# Patient Record
Sex: Female | Born: 1960 | Hispanic: No | Marital: Married | Smoking: Former smoker
Health system: Southern US, Community
[De-identification: ages and names within clinical notes are randomized; demographics above are authoritative.]

## PROBLEM LIST (undated history)

## (undated) DIAGNOSIS — I428 Other cardiomyopathies: Secondary | ICD-10-CM

## (undated) DIAGNOSIS — I5020 Unspecified systolic (congestive) heart failure: Secondary | ICD-10-CM

## (undated) DIAGNOSIS — R0989 Other specified symptoms and signs involving the circulatory and respiratory systems: Secondary | ICD-10-CM

## (undated) DIAGNOSIS — F32A Depression, unspecified: Secondary | ICD-10-CM

## (undated) DIAGNOSIS — R931 Abnormal findings on diagnostic imaging of heart and coronary circulation: Secondary | ICD-10-CM

## (undated) DIAGNOSIS — F419 Anxiety disorder, unspecified: Secondary | ICD-10-CM

## (undated) DIAGNOSIS — E785 Hyperlipidemia, unspecified: Secondary | ICD-10-CM

## (undated) DIAGNOSIS — J189 Pneumonia, unspecified organism: Secondary | ICD-10-CM

## (undated) HISTORY — DX: Hyperlipidemia, unspecified: E78.5

## (undated) HISTORY — DX: Other cardiomyopathies: I42.8

## (undated) HISTORY — DX: Abnormal findings on diagnostic imaging of heart and coronary circulation: R93.1

## (undated) HISTORY — DX: Unspecified systolic (congestive) heart failure: I50.20

## (undated) HISTORY — DX: Other specified symptoms and signs involving the circulatory and respiratory systems: R09.89

---

## 1999-03-06 ENCOUNTER — Other Ambulatory Visit: Admission: RE | Admit: 1999-03-06 | Discharge: 1999-03-06 | Payer: Self-pay | Admitting: Family Medicine

## 2017-06-12 ENCOUNTER — Other Ambulatory Visit: Payer: Self-pay | Admitting: Orthopedic Surgery

## 2017-06-12 DIAGNOSIS — M4726 Other spondylosis with radiculopathy, lumbar region: Secondary | ICD-10-CM

## 2017-06-12 DIAGNOSIS — M4722 Other spondylosis with radiculopathy, cervical region: Secondary | ICD-10-CM

## 2017-06-16 ENCOUNTER — Ambulatory Visit
Admission: RE | Admit: 2017-06-16 | Discharge: 2017-06-16 | Disposition: A | Discharge status: Home / Self Care | Payer: Federal, State, Local not specified - PPO | Source: Ambulatory Visit | Attending: Orthopedic Surgery | Admitting: Orthopedic Surgery

## 2017-06-16 DIAGNOSIS — M4726 Other spondylosis with radiculopathy, lumbar region: Secondary | ICD-10-CM

## 2017-06-16 DIAGNOSIS — M4722 Other spondylosis with radiculopathy, cervical region: Secondary | ICD-10-CM

## 2018-05-16 DIAGNOSIS — M542 Cervicalgia: Secondary | ICD-10-CM

## 2018-05-16 DIAGNOSIS — J45909 Unspecified asthma, uncomplicated: Secondary | ICD-10-CM | POA: Insufficient documentation

## 2018-05-16 DIAGNOSIS — M549 Dorsalgia, unspecified: Secondary | ICD-10-CM | POA: Insufficient documentation

## 2018-05-16 DIAGNOSIS — G8929 Other chronic pain: Secondary | ICD-10-CM

## 2018-05-16 HISTORY — DX: Other chronic pain: G89.29

## 2018-05-16 HISTORY — DX: Cervicalgia: M54.2

## 2018-05-16 HISTORY — DX: Unspecified asthma, uncomplicated: J45.909

## 2018-06-27 DIAGNOSIS — M51369 Other intervertebral disc degeneration, lumbar region without mention of lumbar back pain or lower extremity pain: Secondary | ICD-10-CM

## 2018-06-27 DIAGNOSIS — M5136 Other intervertebral disc degeneration, lumbar region: Secondary | ICD-10-CM

## 2018-06-27 DIAGNOSIS — M5412 Radiculopathy, cervical region: Secondary | ICD-10-CM

## 2018-06-27 HISTORY — DX: Radiculopathy, cervical region: M54.12

## 2018-06-27 HISTORY — DX: Other intervertebral disc degeneration, lumbar region: M51.36

## 2018-06-27 HISTORY — DX: Other intervertebral disc degeneration, lumbar region without mention of lumbar back pain or lower extremity pain: M51.369

## 2018-06-28 DIAGNOSIS — M5416 Radiculopathy, lumbar region: Secondary | ICD-10-CM

## 2018-06-28 HISTORY — DX: Radiculopathy, lumbar region: M54.16

## 2018-07-17 DIAGNOSIS — M419 Scoliosis, unspecified: Secondary | ICD-10-CM

## 2018-07-17 HISTORY — DX: Scoliosis, unspecified: M41.9
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
## 2019-06-09 DIAGNOSIS — R05 Cough: Secondary | ICD-10-CM

## 2019-06-09 DIAGNOSIS — R0600 Dyspnea, unspecified: Secondary | ICD-10-CM

## 2019-06-09 DIAGNOSIS — I340 Nonrheumatic mitral (valve) insufficiency: Secondary | ICD-10-CM

## 2019-06-10 ENCOUNTER — Inpatient Hospital Stay (HOSPITAL_COMMUNITY)
Admission: AD | Admit: 2019-06-10 | Discharge: 2019-06-12 | DRG: 287 | Disposition: A | Discharge status: Home / Self Care | Payer: Federal, State, Local not specified - PPO | Source: Other Acute Inpatient Hospital | Attending: Interventional Cardiology | Admitting: Interventional Cardiology

## 2019-06-10 ENCOUNTER — Encounter (HOSPITAL_COMMUNITY): Payer: Self-pay | Admitting: Interventional Cardiology

## 2019-06-10 DIAGNOSIS — I472 Ventricular tachycardia: Secondary | ICD-10-CM

## 2019-06-10 DIAGNOSIS — Z91040 Latex allergy status: Secondary | ICD-10-CM

## 2019-06-10 DIAGNOSIS — Z20828 Contact with and (suspected) exposure to other viral communicable diseases: Secondary | ICD-10-CM | POA: Diagnosis present

## 2019-06-10 DIAGNOSIS — I200 Unstable angina: Secondary | ICD-10-CM

## 2019-06-10 DIAGNOSIS — R0989 Other specified symptoms and signs involving the circulatory and respiratory systems: Secondary | ICD-10-CM

## 2019-06-10 DIAGNOSIS — I428 Other cardiomyopathies: Secondary | ICD-10-CM | POA: Diagnosis present

## 2019-06-10 DIAGNOSIS — I420 Dilated cardiomyopathy: Secondary | ICD-10-CM

## 2019-06-10 DIAGNOSIS — I5021 Acute systolic (congestive) heart failure: Secondary | ICD-10-CM

## 2019-06-10 DIAGNOSIS — I5020 Unspecified systolic (congestive) heart failure: Secondary | ICD-10-CM

## 2019-06-10 DIAGNOSIS — Z8249 Family history of ischemic heart disease and other diseases of the circulatory system: Secondary | ICD-10-CM

## 2019-06-10 DIAGNOSIS — R079 Chest pain, unspecified: Secondary | ICD-10-CM

## 2019-06-10 DIAGNOSIS — Z87891 Personal history of nicotine dependence: Secondary | ICD-10-CM

## 2019-06-10 HISTORY — DX: Acute systolic (congestive) heart failure: I50.21

## 2019-06-10 LAB — CBC
HCT: 42.7 % (ref 36.0–46.0)
Hemoglobin: 13.8 g/dL (ref 12.0–15.0)
MCH: 29.6 pg (ref 26.0–34.0)
MCHC: 32.3 g/dL (ref 30.0–36.0)
MCV: 91.6 fL (ref 80.0–100.0)
Platelets: 254 10*3/uL (ref 150–400)
RBC: 4.66 MIL/uL (ref 3.87–5.11)
RDW: 13.5 % (ref 11.5–15.5)
WBC: 7.2 10*3/uL (ref 4.0–10.5)
nRBC: 0 % (ref 0.0–0.2)

## 2019-06-10 LAB — COMPREHENSIVE METABOLIC PANEL
ALT: 42 U/L (ref 0–44)
AST: 23 U/L (ref 15–41)
Albumin: 3.8 g/dL (ref 3.5–5.0)
Alkaline Phosphatase: 60 U/L (ref 38–126)
Anion gap: 12 (ref 5–15)
BUN: 23 mg/dL — ABNORMAL HIGH (ref 6–20)
CO2: 27 mmol/L (ref 22–32)
Calcium: 9.1 mg/dL (ref 8.9–10.3)
Chloride: 99 mmol/L (ref 98–111)
Creatinine, Ser: 0.83 mg/dL (ref 0.44–1.00)
GFR calc Af Amer: >60 mL/min (ref >60)
GFR calc non Af Amer: >60 mL/min (ref >60)
Glucose, Bld: 143 mg/dL — ABNORMAL HIGH (ref 70–99)
Potassium: 3.5 mmol/L (ref 3.5–5.1)
Sodium: 138 mmol/L (ref 135–145)
Total Bilirubin: 0.7 mg/dL (ref 0.3–1.2)
Total Protein: 6.8 g/dL (ref 6.5–8.1)

## 2019-06-10 LAB — HIV ANTIBODY (ROUTINE TESTING W REFLEX): HIV Screen 4th Generation wRfx: NONREACTIVE

## 2019-06-10 LAB — HEPARIN LEVEL (UNFRACTIONATED): Heparin Unfractionated: 0.21 IU/mL — ABNORMAL LOW (ref 0.30–0.70)

## 2019-06-10 LAB — BRAIN NATRIURETIC PEPTIDE: B Natriuretic Peptide: 425 pg/mL — ABNORMAL HIGH (ref 0.0–100.0)

## 2019-06-10 MED ORDER — ONDANSETRON HCL 4 MG/2ML IJ SOLN: 4.0000 mg | Freq: Four times a day (QID) | INTRAMUSCULAR | Status: DC | PRN

## 2019-06-10 MED ORDER — NITROGLYCERIN 0.4 MG SL SUBL: 0.4000 mg | SUBLINGUAL_TABLET | SUBLINGUAL | Status: DC | PRN

## 2019-06-10 MED ORDER — SODIUM CHLORIDE 0.9 % IV SOLN: INTRAVENOUS | Status: DC

## 2019-06-10 MED ORDER — SODIUM CHLORIDE 0.9% FLUSH: 3.0000 mL | INTRAVENOUS | Status: DC | PRN

## 2019-06-10 MED ORDER — ACETAMINOPHEN 325 MG PO TABS
650.0000 mg | ORAL_TABLET | ORAL | Status: DC | PRN
  Administered 2019-06-10: 650 mg via ORAL
  Filled 2019-06-10: qty 2

## 2019-06-10 MED ORDER — HEPARIN (PORCINE) 25000 UT/250ML-% IV SOLN
1350.0000 [IU]/h | INTRAVENOUS | Status: DC
  Administered 2019-06-10: 1350 [IU]/h via INTRAVENOUS
  Administered 2019-06-10: 1100 [IU]/h via INTRAVENOUS
  Filled 2019-06-10: qty 250

## 2019-06-10 MED ORDER — CARVEDILOL 3.125 MG PO TABS
3.1250 mg | ORAL_TABLET | Freq: Two times a day (BID) | ORAL | Status: DC
  Administered 2019-06-10: 3.125 mg via ORAL
  Filled 2019-06-10: qty 1

## 2019-06-10 MED ORDER — ASPIRIN 81 MG PO CHEW
81.0000 mg | CHEWABLE_TABLET | ORAL | Status: AC
  Administered 2019-06-11: 81 mg via ORAL
  Filled 2019-06-10: qty 1

## 2019-06-10 MED ORDER — SODIUM CHLORIDE 0.9 % IV SOLN: 250.0000 mL | INTRAVENOUS | Status: DC | PRN

## 2019-06-10 MED ORDER — SODIUM CHLORIDE 0.9% FLUSH
3.0000 mL | Freq: Two times a day (BID) | INTRAVENOUS | Status: DC
  Administered 2019-06-10: 3 mL via INTRAVENOUS

## 2019-06-10 MED ORDER — ASPIRIN EC 81 MG PO TBEC
81.0000 mg | DELAYED_RELEASE_TABLET | Freq: Every day | ORAL | Status: DC
  Administered 2019-06-10 – 2019-06-12 (×2): 81 mg via ORAL
  Filled 2019-06-10 (×3): qty 1

## 2019-06-10 MED ORDER — ATORVASTATIN CALCIUM 40 MG PO TABS
40.0000 mg | ORAL_TABLET | Freq: Every day | ORAL | Status: DC
  Administered 2019-06-10 – 2019-06-11 (×2): 40 mg via ORAL
  Filled 2019-06-10 (×2): qty 1

## 2019-06-10 MED ORDER — CARVEDILOL 3.125 MG PO TABS
3.1250 mg | ORAL_TABLET | Freq: Two times a day (BID) | ORAL | Status: DC
  Administered 2019-06-11 – 2019-06-12 (×3): 3.125 mg via ORAL
  Filled 2019-06-10 (×3): qty 1

## 2019-06-10 NOTE — H&P (Addendum)
The patient has been seen in conjunction with Vin Bhagat, PAC. All aspects of care have been considered and discussed. The patient has been personally interviewed, examined, and all clinical data has been reviewed.  Transferred in from [place] by Dr. Kirtland Bouchard. [place]. New systolic heart failure of unknown duration and etiology. EF < 30%. Not grossly volume overloaded but having cough and DOE for 5-6 weeks. Will have right heart cath to gauge volume status. Also had concerning episode of pain last PM that lasted > 20 minutes and was quite uncomfortable. Exam without evidence of volume overload. S3 gallop present. Markers are negative. Suspect primary CM since LV is dilated. Has no real risk factors for CAD. Chest pain last PM possible Stress layered on primary CM, coronary embolus, or CAD (seems unlikely) Plan Left and right heart cath tomorrow. The patient was counseled to undergo left heart catheterization, coronary angiography, and possible percutaneous coronary intervention with stent implantation. The procedural risks and benefits were discussed in detail. The risks discussed included death, stroke, myocardial infarction, life-threatening bleeding, limb ischemia, kidney injury, allergy, and possible emergency cardiac surgery. The risk of these significant complications were estimated to occur less than 1% of the time. After discussion, the patient has agreed to proceed.      Cardiology Admission History and Physical:   Patient ID: Krista Jenkins MRN: [id number]; DOB: 11-11-1960   Admission date: 06/10/2019  Primary Care Provider: [name], MD Primary Cardiologist: New to Dr. Katrinka Blazing  Chief Complaint:  SOB and CP  Patient Profile:   Krista Jenkins is a 58 y.o. female with no significant past medical history transferred from [facility] for acute onset systolic heart failure.  History of Present Illness:   Ms. Nash was in usual state of health up until 3 weeks ago when she  diagnosed with bronchitis and started on antibiotic.  She had shortness of breath and cough.  Symptoms did not improve and seen by PCP.  Antibiotic change due to pneumonia.  He got tested twice for Covid which turned back as negative.  Due to ongoing symptoms she was last seen by PCP on Friday and noted persistent pneumonia and again placed on antibiotic.  Due to ongoing symptoms she went to [facility] for evaluation of shortness of breath.  She was negative for Covid.  Chest x-ray showed vascular congestion.  CTA of chest negative for pulmonary embolism.  BNP elevated at 5440.  She was given IV Lasix.  Troponin 0 0.02.  Minimally elevated LFTs.  Last night she had a episode of chest pain described as achy sensation.  Repeat EKG shows new lateral T wave inversion.  Echocardiogram showed severely depressed LV function to 20 to 25%.  She was evaluated by Dr. [name] and recommended transfer for cardiac cath.  Father had stenting to his heart in his early 13s.  Brother has history of hypertension. She reports being in usual state of health up until 3 weeks ago.  She was exercising and active without any issue.  Remote tobacco smoking.  She smoked for 10 years during teenage years, quit 30 years ago.  Remote stress test in 2015 without evidence of ischemia.  Heart Pathway Score:     No past medical history on file.  Medications Prior to Admission: Prior to Admission medications   Not on File  Was taking some sort of medication for her back, does not remember name Per review of outside hospital note she was taking amitriptyline 100 mg p.o. SH  Allergies:    Denies any allergies  Social History:   Social History   Socioeconomic History   Marital status: Unknown    Spouse name: Not on file   Number of children: Not on file   Years of education: Not on file   Highest education level: Not on file  Occupational History   Not on file  Tobacco Use   Smoking status: Former Smoker  Substance  and Sexual Activity   Alcohol use: Not on file   Drug use: Not on file   Sexual activity: Not on file  Other Topics Concern   Not on file  Social History Narrative   Not on file   Social Determinants of Health   Financial Resource Strain:    Difficulty of Paying Living Expenses: Not on file  Food Insecurity:    Worried About [place] in the Last Year: Not on file   [place] in the Last Year: Not on file  Transportation Needs:    Lack of Transportation (Medical): Not on file   Lack of Transportation (Non-Medical): Not on file  Physical Activity:    Days of Exercise per Week: Not on file   Minutes of Exercise per Session: Not on file  Stress:    Feeling of Stress : Not on file  Social Connections:    Frequency of Communication with Friends and Family: Not on file   Frequency of Social Gatherings with Friends and Family: Not on file   Attends Religious Services: Not on file   Active Member of Clubs or Organizations: Not on file   Attends Archivist Meetings: Not on file   Marital Status: Not on file  Intimate Partner Violence:    Fear of Current or Ex-Partner: Not on file   Emotionally Abused: Not on file   Physically Abused: Not on file   Sexually Abused: Not on file    Family History:  The patient's family history includes CAD in her father; Hypertension in her brother.    ROS:  Please see the history of present illness.  All other ROS reviewed and negative.     Physical Exam/Data:   Vitals:   06/10/19 1510 06/10/19 1515  BP: 113/61 113/61  Pulse: (!) 123 (!) 121  Resp: 20 20  Temp: 99.5 F (37.5 C) 99.5 F (37.5 C)  TempSrc: Oral Oral  SpO2: 92%    No intake or output data in the 24 hours ending 06/10/19 1637 No flowsheet data found.   There is no height or weight on file to calculate BMI.  General:  Well nourished, well developed, in no acute distress HEENT: normal Lymph: no adenopathy Neck: no JVD Endocrine:  No thryomegaly  Vascular: No carotid bruits; FA pulses 2+ bilaterally without bruits  Cardiac:  normal S1, S2; RRR; no murmur  Lungs:  clear to auscultation bilaterally, no wheezing, rhonchi or rales  Abd: soft, nontender, no hepatomegaly  Ext: no  edema Musculoskeletal:  No deformities, BUE and BLE strength normal and equal Skin: warm and dry  Neuro:  CNs 2-12 intact, no focal abnormalities noted Psych:  Normal affect   EKG:  The ECG that was done 06/10/2019 at [facility] was personally reviewed and demonstrates sinus bradycardia at rate of 56 bpm, incomplete right bundle branch block, T wave inversion laterally  Relevant CV Studies: As summarized above  Reviewed outside hospital records.   Laboratory Data:  Troponin I 0.03 x 3 TSH 1.99 Hemoglobin  A1c 5.5 Covid negative WBC 6.3, RBC 4.27, hemoglobin 12.8, hematocrit 38.6 Creatinine 0.9, GFR greater than 60, glucose 124, AST 35, ALT 49, alkaline phosphatase 62, total protein 6.6, albumin 3.9 Triglyceride 147, total cholesterol 206, LDL 132, HDL 44 BNP 5440  Radiology/Studies:  No results found.  Assessment and Plan:   Acute systolic heart failure 3 weeks history of shortness of breath and cough.  She was treated with 3 rounds of antibiotic for bronchitis initially and then pneumonia.  Chest x-ray and chest CT at outside hospital without evidence of pneumonia.  No evidence of pulmonary embolism.  BNP elevated, treated with IV Lasix with improved breathing.  She does not look significantly volume overloaded by exam.  Start beta-blocker and ACE or ARB.   2.  Chest pain -Episode occurred last night at outside hospital.  EKG with new lateral changes.  Troponin negative. LDL 132 Hemoglobin A1c 5.5 TSH normal  Severity of Illness: The appropriate patient status for this patient is INPATIENT. Inpatient status is judged to be reasonable and necessary in order to provide the required intensity of service to ensure the patient's safety. The  patient's presenting symptoms, physical exam findings, and initial radiographic and laboratory data in the context of their chronic comorbidities is felt to place them at high risk for further clinical deterioration. Furthermore, it is not anticipated that the patient will be medically stable for discharge from the hospital within 2 midnights of admission. The following factors support the patient status of inpatient.   " The patient's presenting symptoms include CP, SOB and cough. " The worrisome physical exam findings include N/A " The initial radiographic and laboratory data are worrisome because of elevated BNP " The chronic co-morbidities include recent pneumonia   * I certify that at the point of admission it is my clinical judgment that the patient will require inpatient hospital care spanning beyond 2 midnights from the point of admission due to high intensity of service, high risk for further deterioration and high frequency of surveillance required.*    For questions or updates, please contact [facility] Please consult [email or web address] for contact info under        Lorelei Pont, [place]  06/10/2019 4:37 PM

## 2019-06-10 NOTE — Progress Notes (Addendum)
Addendum: Heparin level low at 0.21 on 1100 units/hr.  CBC stable. No bleeding or issues noted.   Plan: Increase Heparin to 1350 units/hr.  Re-check Heparin level in AM.   Sloan Leiter, PharmD, BCPS, BCCCP Clinical Pharmacist Please refer to [facility] for [place] numbers 06/10/2019, 6:40 PM    ANTICOAGULATION CONSULT NOTE - Initial Consult  Pharmacy Consult for IV Heparin Indication: chest pain/ACS  Not on File  Patient Measurements: Height: 5\' 6"  (167.6 cm)(Per RN report) Weight: 186 lb 1.6 oz (84.4 kg)(Per RN report) IBW/kg (Calculated) : 59.3  Heparin Dosing Weight: 77.2 kg  Vital Signs: Temp: 99.5 F (37.5 C) (12/16 1515) Temp Source: Oral (12/16 1515) BP: 113/61 (12/16 1515) Pulse Rate: 121 (12/16 1515)  Labs: No results for input(s): HGB, HCT, PLT, APTT, LABPROT, INR, HEPARINUNFRC, HEPRLOWMOCWT, CREATININE, CKTOTAL, CKMB, TROPONINIHS in the last 72 hours.  CrCl cannot be calculated (No successful lab value found.).   Medical History: No past medical history on file.   Assessment: 58 year old female transferred from [facility] on 06/10/19 with shortness of breath and chest pain. CTA is negative for pulmonary embolism. EF 20-25% on ECHO.   Heparin currently infusing at rate of 1100 units/hr on transfer from [place].  Per [place]: Baseline labs PT 11.4, INR 1, PTT 23.9 (12/15 PM). 12/16 AM: Hgb 12.8, Platelets 220, Hct 38.6, and SCr 0.9. Highest Trop was 0.05.  Lovenox 40 SQ at 1710 PM on 06/09/19. Patient received Heparin 4000 unit bolus at 1144 AM today followed by infusion of 1100 units/hr. No bleeding or issues with infusion noted.   Will continue current rate and check a level since we are now ~6 hours from start of infusion.   Goal of Therapy:  Heparin level 0.3-0.7 units/ml Monitor platelets by anticoagulation protocol: Yes   Plan:  Continue Heparin at 1100 units/hr.  Check a heparin level at 1730 PM.  Monitor Heparin level  and CBC daily while on therapy.   Sloan Leiter, PharmD, BCPS, BCCCP Clinical Pharmacist Please refer to [facility] for [place] numbers 06/10/2019,4:47 PM

## 2019-06-11 ENCOUNTER — Encounter (HOSPITAL_COMMUNITY)
Admission: AD | Disposition: A | Discharge status: Home / Self Care | Payer: Self-pay | Source: Other Acute Inpatient Hospital | Attending: Interventional Cardiology

## 2019-06-11 DIAGNOSIS — R05 Cough: Secondary | ICD-10-CM

## 2019-06-11 HISTORY — PX: RIGHT/LEFT HEART CATH AND CORONARY ANGIOGRAPHY: CATH118266

## 2019-06-11 LAB — GLUCOSE, CAPILLARY: Glucose-Capillary: 102 mg/dL — ABNORMAL HIGH (ref 70–99)

## 2019-06-11 LAB — POCT I-STAT EG7
Acid-Base Excess: 4 mmol/L — ABNORMAL HIGH (ref 0.0–2.0)
Bicarbonate: 30.1 mmol/L — ABNORMAL HIGH (ref 20.0–28.0)
Calcium, Ion: 1.2 mmol/L (ref 1.15–1.40)
HCT: 42 % (ref 36.0–46.0)
Hemoglobin: 14.3 g/dL (ref 12.0–15.0)
O2 Saturation: 66 %
Potassium: 3.8 mmol/L (ref 3.5–5.1)
Sodium: 139 mmol/L (ref 135–145)
TCO2: 32 mmol/L (ref 22–32)
pCO2, Ven: 50.4 mmHg (ref 44.0–60.0)
pH, Ven: 7.384 (ref 7.250–7.430)
pO2, Ven: 35 mmHg (ref 32.0–45.0)

## 2019-06-11 LAB — POCT I-STAT 7, (LYTES, BLD GAS, ICA,H+H)
Acid-Base Excess: 3 mmol/L — ABNORMAL HIGH (ref 0.0–2.0)
Bicarbonate: 28.3 mmol/L — ABNORMAL HIGH (ref 20.0–28.0)
Calcium, Ion: 1.19 mmol/L (ref 1.15–1.40)
HCT: 42 % (ref 36.0–46.0)
Hemoglobin: 14.3 g/dL (ref 12.0–15.0)
O2 Saturation: 94 %
Potassium: 3.9 mmol/L (ref 3.5–5.1)
Sodium: 139 mmol/L (ref 135–145)
TCO2: 30 mmol/L (ref 22–32)
pCO2 arterial: 42.6 mmHg (ref 32.0–48.0)
pH, Arterial: 7.43 (ref 7.350–7.450)
pO2, Arterial: 71 mmHg — ABNORMAL LOW (ref 83.0–108.0)

## 2019-06-11 LAB — CBC
HCT: 40.6 % (ref 36.0–46.0)
Hemoglobin: 13.7 g/dL (ref 12.0–15.0)
MCH: 30.3 pg (ref 26.0–34.0)
MCHC: 33.7 g/dL (ref 30.0–36.0)
MCV: 89.8 fL (ref 80.0–100.0)
Platelets: 206 10*3/uL (ref 150–400)
RBC: 4.52 MIL/uL (ref 3.87–5.11)
RDW: 13.1 % (ref 11.5–15.5)
WBC: 5.3 10*3/uL (ref 4.0–10.5)
nRBC: 0 % (ref 0.0–0.2)

## 2019-06-11 LAB — HEPARIN LEVEL (UNFRACTIONATED): Heparin Unfractionated: 0.46 IU/mL (ref 0.30–0.70)

## 2019-06-11 LAB — C-REACTIVE PROTEIN: CRP: 0.6 mg/dL

## 2019-06-11 LAB — SARS CORONAVIRUS 2 (TAT 6-24 HRS): SARS Coronavirus 2: NEGATIVE

## 2019-06-11 LAB — TSH: TSH: 2.607 u[IU]/mL (ref 0.350–4.500)

## 2019-06-11 SURGERY — RIGHT/LEFT HEART CATH AND CORONARY ANGIOGRAPHY
Anesthesia: LOCAL

## 2019-06-11 MED ORDER — SODIUM CHLORIDE 0.9% FLUSH
3.0000 mL | Freq: Two times a day (BID) | INTRAVENOUS | Status: DC
  Administered 2019-06-11 (×2): 3 mL via INTRAVENOUS

## 2019-06-11 MED ORDER — ONDANSETRON HCL 4 MG/2ML IJ SOLN
4.0000 mg | Freq: Four times a day (QID) | INTRAMUSCULAR | Status: DC | PRN
  Administered 2019-06-11: 4 mg via INTRAVENOUS
  Filled 2019-06-11: qty 2

## 2019-06-11 MED ORDER — HEPARIN SODIUM (PORCINE) 1000 UNIT/ML IJ SOLN
INTRAMUSCULAR | Status: DC | PRN
  Administered 2019-06-11: 4000 [IU] via INTRAVENOUS

## 2019-06-11 MED ORDER — HEPARIN (PORCINE) IN NACL 1000-0.9 UT/500ML-% IV SOLN
INTRAVENOUS | Status: DC | PRN
  Administered 2019-06-11 (×2): 500 mL

## 2019-06-11 MED ORDER — FENTANYL CITRATE (PF) 100 MCG/2ML IJ SOLN
INTRAMUSCULAR | Status: DC | PRN
  Administered 2019-06-11: 25 ug via INTRAVENOUS

## 2019-06-11 MED ORDER — SODIUM CHLORIDE 0.9 % IV SOLN: 250.0000 mL | INTRAVENOUS | Status: DC | PRN

## 2019-06-11 MED ORDER — VERAPAMIL HCL 2.5 MG/ML IV SOLN
INTRAVENOUS | Status: AC
  Filled 2019-06-11: qty 2

## 2019-06-11 MED ORDER — HYDRALAZINE HCL 20 MG/ML IJ SOLN: 10.0000 mg | INTRAMUSCULAR | Status: DC | PRN

## 2019-06-11 MED ORDER — SACUBITRIL-VALSARTAN 24-26 MG PO TABS
0.5000 | ORAL_TABLET | Freq: Two times a day (BID) | ORAL | Status: DC
  Administered 2019-06-11 (×2): 0.5 via ORAL
  Filled 2019-06-11 (×2): qty 0.5

## 2019-06-11 MED ORDER — AMITRIPTYLINE HCL 50 MG PO TABS
50.0000 mg | ORAL_TABLET | Freq: Every day | ORAL | Status: DC
  Administered 2019-06-11: 50 mg via ORAL
  Filled 2019-06-11 (×2): qty 1

## 2019-06-11 MED ORDER — MIDAZOLAM HCL 2 MG/2ML IJ SOLN
INTRAMUSCULAR | Status: AC
  Filled 2019-06-11: qty 2

## 2019-06-11 MED ORDER — HEPARIN SODIUM (PORCINE) 1000 UNIT/ML IJ SOLN
INTRAMUSCULAR | Status: AC
  Filled 2019-06-11: qty 1

## 2019-06-11 MED ORDER — SODIUM CHLORIDE 0.9% FLUSH: 3.0000 mL | INTRAVENOUS | Status: DC | PRN

## 2019-06-11 MED ORDER — ACETAMINOPHEN 325 MG PO TABS: 650.0000 mg | ORAL_TABLET | ORAL | Status: DC | PRN

## 2019-06-11 MED ORDER — LIDOCAINE HCL (PF) 1 % IJ SOLN
INTRAMUSCULAR | Status: AC
  Filled 2019-06-11: qty 30

## 2019-06-11 MED ORDER — HYDROCOD POLST-CPM POLST ER 10-8 MG/5ML PO SUER
5.0000 mL | Freq: Two times a day (BID) | ORAL | Status: DC
  Administered 2019-06-11 – 2019-06-12 (×3): 5 mL via ORAL
  Filled 2019-06-11 (×3): qty 5

## 2019-06-11 MED ORDER — VERAPAMIL HCL 2.5 MG/ML IV SOLN
INTRAVENOUS | Status: DC | PRN
  Administered 2019-06-11: 10 mL via INTRA_ARTERIAL

## 2019-06-11 MED ORDER — LIDOCAINE HCL (PF) 1 % IJ SOLN
INTRAMUSCULAR | Status: DC | PRN
  Administered 2019-06-11 (×2): 2 mL

## 2019-06-11 MED ORDER — FENTANYL CITRATE (PF) 100 MCG/2ML IJ SOLN
INTRAMUSCULAR | Status: AC
  Filled 2019-06-11: qty 2

## 2019-06-11 MED ORDER — MIDAZOLAM HCL 2 MG/2ML IJ SOLN
INTRAMUSCULAR | Status: DC | PRN
  Administered 2019-06-11: 2 mg via INTRAVENOUS

## 2019-06-11 MED ORDER — SPIRONOLACTONE 12.5 MG HALF TABLET
12.5000 mg | ORAL_TABLET | Freq: Every day | ORAL | Status: DC
  Administered 2019-06-11 – 2019-06-12 (×2): 12.5 mg via ORAL
  Filled 2019-06-11 (×2): qty 1

## 2019-06-11 MED ORDER — IOHEXOL 350 MG/ML SOLN
INTRAVENOUS | Status: DC | PRN
  Administered 2019-06-11: 45 mL

## 2019-06-11 MED ORDER — LABETALOL HCL 5 MG/ML IV SOLN: 10.0000 mg | INTRAVENOUS | Status: DC | PRN

## 2019-06-11 SURGICAL SUPPLY — 12 items

## 2019-06-11 NOTE — Progress Notes (Signed)
[place] for IV Heparin Indication: chest pain/ACS  Allergies  Allergen Reactions  . Latex     Patient Measurements: Height: 5\' 6"  (167.6 cm)(Per RN report) Weight: 186 lb 3.2 oz (84.5 kg)(scale c) IBW/kg (Calculated) : 59.3  Heparin Dosing Weight: 77.2 kg  Vital Signs: Temp: 97.7 F (36.5 C) (12/17 0807) Temp Source: Oral (12/17 0807) BP: 99/74 (12/17 0807) Pulse Rate: 76 (12/17 0807)  Labs: Recent Labs    06/10/19 1715 06/11/19 0735  HGB 13.8 13.7  HCT 42.7 40.6  PLT 254 206  HEPARINUNFRC 0.21* 0.46  CREATININE 0.83  --     Estimated Creatinine Clearance: 80.9 mL/min (by C-G formula based on SCr of 0.83 mg/dL).   Medical History: No past medical history on file.   Scheduled Meds: . aspirin EC  81 mg Oral Daily  . atorvastatin  40 mg Oral q1800  . carvedilol  3.125 mg Oral BID WC  . sodium chloride flush  3 mL Intravenous Q12H   Continuous Infusions: . sodium chloride    . sodium chloride 10 mL/hr at 06/11/19 0559  . heparin 1,350 Units/hr (06/10/19 2355)     Assessment: 58 year old female transferred from [facility] on 06/10/19 with shortness of breath and chest pain. CTA is negative for pulmonary embolism. EF 20-25% on ECHO.   Per [place]: Baseline labs PT 11.4, INR 1, PTT 23.9 (12/15 PM). 12/16 AM: Hgb 12.8, Platelets 220, Hct 38.6, and SCr 0.9. Highest Trop was 0.05.  Lovenox 40 SQ at 1710 PM on 06/09/19.   Heparin level at goal this AM, no overt bleeding or complications noted, CBC stable.  Awaiting R/LHC this AM.  Goal of Therapy:  Heparin level 0.3-0.7 units/ml Monitor platelets by anticoagulation protocol: Yes   Plan:  Continue Heparin at current rate. F/u plans for heparin after cath this AM.  [place] Crane, Roylene Reason, [place] Pharmacist Phone [phone]  06/11/2019 8:55 AM

## 2019-06-11 NOTE — Progress Notes (Signed)
Progress Note  Patient Name: Krista Jenkins Date of Encounter: 06/11/2019  Primary Cardiologist: Thomasene Ripple, MD  Subjective   Feels better.  Discussed findings at coronary angiography.  Had discussion concerning nonischemic cardiomyopathy and treatment options.  She now understands that therapeutic options are primarily guideline directed medical therapy, possibly AICD in the future, and possibly advanced heart failure therapies should she worsen or become more symptomatic.  Inpatient Medications    Scheduled Meds: . amitriptyline  50 mg Oral QHS  . aspirin EC  81 mg Oral Daily  . atorvastatin  40 mg Oral q1800  . carvedilol  3.125 mg Oral BID WC  . sacubitril-valsartan  0.5 tablet Oral BID  . sodium chloride flush  3 mL Intravenous Q12H  . sodium chloride flush  3 mL Intravenous Q12H  . spironolactone  12.5 mg Oral Daily   Continuous Infusions: . sodium chloride     PRN Meds: sodium chloride, acetaminophen, hydrALAZINE, labetalol, nitroGLYCERIN, ondansetron (ZOFRAN) IV, sodium chloride flush   Vital Signs    Vitals:   06/11/19 0512 06/11/19 0528 06/11/19 0807 06/11/19 0916  BP: 107/73  99/74   Pulse: (!) 107  76   Resp: 20  20   Temp: 97.8 F (36.6 C)  97.7 F (36.5 C)   TempSrc: Oral  Oral   SpO2: 93%  91% 98%  Weight:  84.5 kg    Height:        Intake/Output Summary (Last 24 hours) at 06/11/2019 1125 Last data filed at 06/11/2019 0823 Gross per 24 hour  Intake 408.56 ml  Output 800 ml  Net -391.44 ml   Last 3 Weights 06/11/2019 06/10/2019  Weight (lbs) 186 lb 3.2 oz 186 lb 1.6 oz  Weight (kg) 84.46 kg 84.414 kg      Telemetry    Normal sinus rhythm without ventricular ectopy.- Personally Reviewed  ECG    Sinus rhythm, prominent voltage, left anterior hemiblock, nonspecific T wave abnormality.,- Personally Reviewed   Physical Exam  Stable, in no acute respiratory distress.  Sitting at 90 degrees in her bed. GEN:  Skin color is normal. Neck:  No JVD while sitting at 90 degrees. Cardiac:  S3 gallop. Respiratory: Clear to auscultation bilaterally. GI: Soft, nontender, non-distended  MS: No edema; No deformity. Neuro:  Nonfocal  Psych: Sad because of diagnosis  Labs    High Sensitivity Troponin:  No results for input(s): TROPONINIHS in the last 720 hours.    Chemistry Recent Labs  Lab 06/10/19 1715  NA 138  K 3.5  CL 99  CO2 27  GLUCOSE 143*  BUN 23*  CREATININE 0.83  CALCIUM 9.1  PROT 6.8  ALBUMIN 3.8  AST 23  ALT 42  ALKPHOS 60  BILITOT 0.7  GFRNONAA >60  GFRAA >60  ANIONGAP 12     Hematology Recent Labs  Lab 06/10/19 1715 06/11/19 0735  WBC 7.2 5.3  RBC 4.66 4.52  HGB 13.8 13.7  HCT 42.7 40.6  MCV 91.6 89.8  MCH 29.6 30.3  MCHC 32.3 33.7  RDW 13.5 13.1  PLT 254 206    BNP Recent Labs  Lab 06/10/19 1715  BNP 425.0*     DDimer No results for input(s): DDIMER in the last 168 hours.   Radiology    CARDIAC CATHETERIZATION  Addendum Date: 06/11/2019    There is severe left ventricular systolic dysfunction.  The left ventricular ejection fraction is less than 25% by visual estimate.  LV end diastolic pressure is mildly  elevated. LVEDP 20 mm Hg.  There is no aortic valve stenosis.  Ao sat 94%, PA sat 66%, mean PA pressure 18 mm Hg, mean PCWP 15 mm Hg; CO 4.9 L/min; CI 2.5  No angiographically apparent CAD.  Nonischemic cardiomyopathy.  Continue aggressive medical therapy.   Result Date: 06/11/2019  There is severe left ventricular systolic dysfunction.  The left ventricular ejection fraction is less than 25% by visual estimate.  LV end diastolic pressure is mildly elevated. LVEDP 20 mm Hg.  There is no aortic valve stenosis.  Ao sat 94%, PA sat 66%, mean PA pressure 18 mm Hg, mean PCWP 15 mm Hg; CO 4.9 L/min; CI 2.5  Nonischemic cardiomyopathy.  Continue aggressive medical therapy.    Cardiac Studies   Cardiac cath results are noted above.  There is slight elevation in LVEDP.   The therapy is outlined will help to manage.  Patient Profile     58 y.o. female with no significant past medical history transferred from [facility] for "new" onset systolic heart failure (5 to 6 weeks).  Was admitted at [facility] after having chest pain.  Assessment & Plan    1. Systolic heart failure, "new", of unknown duration: Coronary artery disease has been excluded by cath today.  Relatively low blood pressures are a threat to initiation of full guideline directed therapy for systolic dysfunction.  Low-dose carvedilol, Entresto, and Aldactone are being sequentially started.  We will hold in the hospital 1 additional day.  Will eventually need to have a cardiac MRI to see if this gives the signal relative to etiology.  Needs an advanced heart failure clinic evaluation, which can be done as an outpatient.  Plan inpatient cardiac rehab and teaching relative to heart failure.  Will need dietary instructions as well.  Discussed potential for downstream defibrillator if LV function does not dramatically improve.  Also discussed advanced heart failure team as strategy to provide expanded treatment options if needed in the future. 2. Cough, presumed secondary to elevated pulmonary wedge pressures.  Low-dose diuretic therapy was started.  Will supply the patient with cough syrup as well.  Overall plan is to institute a 3 pronged therapeutic regimen which should be further titrated as an outpatient as tolerated.  SGLT2 therapy can also be started at some later date.  If all goes well today, will plan discharge in a.m.  For questions or updates, please contact [place] Please consult [email or web address] for contact info under        Signed, Sinclair Grooms, MD  06/11/2019, 11:25 AM  \

## 2019-06-11 NOTE — Interval H&P Note (Signed)
Cath Lab Visit (complete for each Cath Lab visit)  Clinical Evaluation Leading to the Procedure:   ACS: Yes.    Non-ACS:    Anginal Classification: CCS IV  Anti-ischemic medical therapy: Minimal Therapy (1 class of medications)  Non-Invasive Test Results: No non-invasive testing performed  Prior CABG: No previous CABG      History and Physical Interval Note:  06/11/2019 8:50 AM  Krista Jenkins  has presented today for surgery, with the diagnosis of heart failure - EKG changes.  The various methods of treatment have been discussed with the patient and family. After consideration of risks, benefits and other options for treatment, the patient has consented to  Procedure(s): RIGHT/LEFT HEART CATH AND CORONARY ANGIOGRAPHY (N/A) as a surgical intervention.  The patient's history has been reviewed, patient examined, no change in status, stable for surgery.  I have reviewed the patient's chart and labs.  Questions were answered to the patient's satisfaction.     Larae Grooms

## 2019-06-11 NOTE — Research (Signed)
PHDE Informed Consent   Subject Name: Krista Jenkins  Subject met inclusion and exclusion criteria.  The informed consent form, study requirements and expectations were reviewed with the subject and questions and concerns were addressed prior to the signing of the consent form.  The subject verbalized understanding of the trail requirements.  The subject agreed to participate in the PHDE trial and signed the informed consent.  The informed consent was obtained prior to performance of any protocol-specific procedures for the subject.  A copy of the signed informed consent was given to the subject and a copy was placed in the subject's medical record.  [name] 06/11/2019, 8:30 AM

## 2019-06-12 LAB — CBC
HCT: 44.5 % (ref 36.0–46.0)
Hemoglobin: 14.1 g/dL (ref 12.0–15.0)
MCH: 29.9 pg (ref 26.0–34.0)
MCHC: 31.7 g/dL (ref 30.0–36.0)
MCV: 94.3 fL (ref 80.0–100.0)
Platelets: 208 10*3/uL (ref 150–400)
RBC: 4.72 MIL/uL (ref 3.87–5.11)
RDW: 13.1 % (ref 11.5–15.5)
WBC: 5.2 10*3/uL (ref 4.0–10.5)
nRBC: 0 % (ref 0.0–0.2)

## 2019-06-12 LAB — BASIC METABOLIC PANEL
Anion gap: 10 (ref 5–15)
BUN: 24 mg/dL — ABNORMAL HIGH (ref 6–20)
CO2: 26 mmol/L (ref 22–32)
Calcium: 8.8 mg/dL — ABNORMAL LOW (ref 8.9–10.3)
Chloride: 103 mmol/L (ref 98–111)
Creatinine, Ser: 0.86 mg/dL (ref 0.44–1.00)
GFR calc Af Amer: >60 mL/min (ref >60)
GFR calc non Af Amer: >60 mL/min (ref >60)
Glucose, Bld: 109 mg/dL — ABNORMAL HIGH (ref 70–99)
Potassium: 4.3 mmol/L (ref 3.5–5.1)
Sodium: 139 mmol/L (ref 135–145)

## 2019-06-12 LAB — HEMOGLOBIN A1C
Hgb A1c MFr Bld: 6 % — ABNORMAL HIGH (ref 4.8–5.6)
Mean Plasma Glucose: 125.5 mg/dL

## 2019-06-12 MED ORDER — SACUBITRIL-VALSARTAN 24-26 MG PO TABS: 1.0000 | ORAL_TABLET | Freq: Two times a day (BID) | ORAL | 11 refills | Status: DC

## 2019-06-12 MED ORDER — HYDROCOD POLST-CPM POLST ER 10-8 MG/5ML PO SUER: 5.0000 mL | Freq: Two times a day (BID) | ORAL | 0 refills | Status: DC | PRN

## 2019-06-12 MED ORDER — SPIRONOLACTONE 25 MG PO TABS: 12.5000 mg | ORAL_TABLET | Freq: Every day | ORAL | 11 refills | Status: DC

## 2019-06-12 MED ORDER — SACUBITRIL-VALSARTAN 24-26 MG PO TABS
1.0000 | ORAL_TABLET | Freq: Two times a day (BID) | ORAL | Status: DC
  Filled 2019-06-12: qty 1

## 2019-06-12 MED ORDER — ATORVASTATIN CALCIUM 40 MG PO TABS: 40.0000 mg | ORAL_TABLET | Freq: Every day | ORAL | 3 refills | Status: DC

## 2019-06-12 MED ORDER — CARVEDILOL 3.125 MG PO TABS: 3.1250 mg | ORAL_TABLET | Freq: Two times a day (BID) | ORAL | 3 refills | Status: DC

## 2019-06-12 MED FILL — CARVEDILOL 3.125 MG TABLET: 3.125 | 30 days supply | Qty: 60 | Fill #0

## 2019-06-12 MED FILL — SPIRONOLACTONE 25 MG TABLET: 25 | 30 days supply | Qty: 15 | Fill #0

## 2019-06-12 MED FILL — ATORVASTATIN CALCIUM 40 MG: 40 | 30 days supply | Qty: 30 | Fill #0

## 2019-06-12 NOTE — Progress Notes (Signed)
Raiford Noble to be D/C'd Home per MD order.  Discussed with the patient and all questions fully answered.  VSS, Skin clean, dry and intact without evidence of skin break down, no evidence of skin tears noted.  IV catheter discontinued intact. Site without signs and symptoms of complications. Dressing and pressure applied.  An After Visit Summary was printed and given to the patient. Patient received prescription.  D/c education completed with patient/family including follow up instructions, medication list, d/c activities limitations if indicated, with other d/c instructions as indicated by MD - patient able to verbalize understanding, all questions fully answered.   Patient instructed to return to ED, call 911, or call MD for any changes in condition.   Patient escorted via [place], and D/C home via private auto.  Howard Pouch 06/12/2019 7:33 PM

## 2019-06-12 NOTE — Progress Notes (Addendum)
CARDIAC REHAB PHASE I   PRE:  Rate/Rhythm: 93 SR    BP: sitting 92/68    SaO2: 97 RA  MODE:  Ambulation: 600 ft   POST:  Rate/Rhythm: 112 ST    BP: sitting 102/73     SaO2: 97 RA  Tolerated fairly well. Slight SOB with distance. Overall feels better. BP appropriate. Long discussion of HF booklet including daily wts, signs of fluid, meds, low sodium diet, and exercise. Will refer to [place]. She is very receptive. Encouraged her also to take covid precautions.  Pt is interested in participating in Virtual Cardiac and Pulmonary Rehab. Pt advised that Virtual Cardiac and Pulmonary Rehab is provided at no cost to the patient.  Checklist:  1. Pt has smart device  ie smartphone and/or ipad for downloading an app  Yes 2. Reliable internet/wifi service    Yes 3. Understands how to use their smartphone and navigate within an app.  Yes Pt verbalized understanding and is in agreement.  [place], ACSM 06/12/2019 9:28 AM

## 2019-06-12 NOTE — Discharge Summary (Addendum)
The patient has been seen in conjunction with Judy Pimple, PA-C. All aspects of care have been considered and discussed. The patient has been personally interviewed, examined, and all clinical data has been reviewed.   Unable to tolerate 3 drug therapy at this time.  Low normal blood pressures on beta-blocker and Aldactone.  We will continue the low-dose carvedilol and Aldactone.  Discharged today.  Basic metabolic panel in 1 week.  Will need to have ARB or Ulysees Barns started a little later when she has adjusted to the current medical regimen.  Ready for discharge.     Discharge Summary    Patient ID: Krista Jenkins,  MRN: [id number], DOB/AGE: 09-10-1960 58 y.o.  Admit date: 06/10/2019 Discharge date: 06/12/2019  Primary Care Provider: [name] Primary Cardiologist: Thomasene Ripple, DO  Discharge Diagnoses    Active Problems:   Acute systolic CHF (congestive heart failure) (HCC)   Allergies Allergies  Allergen Reactions  . Latex     Diagnostic Studies/Procedures    Right/Left heart catheterization 06/11/2019:  There is severe left ventricular systolic dysfunction.  The left ventricular ejection fraction is less than 25% by visual estimate.  LV end diastolic pressure is mildly elevated. LVEDP 20 mm Hg.  There is no aortic valve stenosis.  Ao sat 94%, PA sat 66%, mean PA pressure 18 mm Hg, mean PCWP 15 mm Hg; CO 4.9 L/min; CI 2.5  No angiographically apparent CAD.   Nonischemic cardiomyopathy.  Continue aggressive medical therapy.  _____________   History of Present Illness     58 y.o. female with no significant past medical history transferred from [facility] for acute onset systolic heart failure. Ms. Nekola was in usual state of health up until 3 weeks ago when she diagnosed with bronchitis and started on antibiotic.  She had shortness of breath and cough.  Symptoms did not improve and seen by PCP.  Antibiotic change due to pneumonia.  He got tested  twice for Covid which turned back as negative.  Due to ongoing symptoms she was last seen by PCP on Friday and noted persistent pneumonia and again placed on antibiotic.  Due to ongoing symptoms she went to [facility] for evaluation of shortness of breath 06/09/2019.  She was negative for Covid.  Chest x-ray showed vascular congestion.  CTA of chest negative for pulmonary embolism.  BNP elevated at 5440.  She was given IV Lasix.  Troponin 0 0.02.  Minimally elevated LFTs.   Last night she had a episode of chest pain described as achy sensation.  Repeat EKG shows new lateral T wave inversion.  Echocardiogram showed severely depressed LV function to 20 to 25%.  She was evaluated by Dr. [name] and recommended transfer for cardiac cath.  Father had stenting to his heart in his early 48s.  Brother has history of hypertension. She reports being in usual state of health up until 3 weeks ago.  She was exercising and active without any issue.  Remote tobacco smoking.  She smoked for 10 years during teenage years, quit 30 years ago.  Remote stress test in 2015 without evidence of ischemia.  Hospital Course     Consultants: None   1. Acute combined CHF: patient presented with progressive SOB not responsive to outpatient antibiotics for management of PNA. Found to have pulmonary vascular congestion on CXR. BNP elevated to 5000s. She subsequently developed chest pain. Trops were negative. Echo revelaed EF 20-25%. She was seen by cardiology at [facility] and  recommended for transfer to [facility] for further evaluation. She underwent a LHC which revealed normal coronary arteries and severe LV dysfunction. She was started on carvedilol and spironolactone for GDT. Unfortunately hypotension limited addition of entresto this admission. Symptoms improved with management of CHF.  - Continue carvedilol 3.125mg  BID - Continue spironolactone 12.5mg  daily - Consider adding entresto outpatient if BP will allow -  Anticipate outpatient cardiac MR to further evaluate cardiomyopathy  - Anticipate repeat echo in 3 months to evaluate for improvement in EF  Close outpatient follow-up arranged. She will need a BMET at that time for close monitoring of electrolytes and renal function.  _____________  Discharge Vitals Blood pressure 116/73, pulse (!) 104, temperature (!) 97.4 F (36.3 C), temperature source Oral, resp. rate 20, height 5\' 6"  (1.676 m), weight 85 kg, SpO2 93 %.  Filed Weights   06/10/19 1700 06/11/19 0528 06/12/19 0639  Weight: 84.4 kg 84.5 kg 85 kg    Labs & Radiologic Studies    CBC Recent Labs    06/11/19 0735 06/11/19 0941 06/12/19 0437  WBC 5.3  --  5.2  HGB 13.7 14.3 14.1  HCT 40.6 42.0 44.5  MCV 89.8  --  94.3  PLT 206  --  010   Basic Metabolic Panel Recent Labs    06/10/19 1715 06/11/19 0941 06/12/19 0437  NA 138 139 139  K 3.5 3.8 4.3  CL 99  --  103  CO2 27  --  26  GLUCOSE 143*  --  109*  BUN 23*  --  24*  CREATININE 0.83  --  0.86  CALCIUM 9.1  --  8.8*   Liver Function Tests Recent Labs    06/10/19 1715  AST 23  ALT 42  ALKPHOS 60  BILITOT 0.7  PROT 6.8  ALBUMIN 3.8   No results for input(s): LIPASE, AMYLASE in the last 72 hours. Cardiac Enzymes No results for input(s): CKTOTAL, CKMB, CKMBINDEX, TROPONINI in the last 72 hours. BNP Invalid input(s): POCBNP D-Dimer No results for input(s): DDIMER in the last 72 hours. Hemoglobin A1C Recent Labs    06/12/19 0437  HGBA1C 6.0*   Fasting Lipid Panel No results for input(s): CHOL, HDL, LDLCALC, TRIG, CHOLHDL, LDLDIRECT in the last 72 hours. Thyroid Function Tests Recent Labs    06/10/19 1715  TSH 2.607   _____________  CARDIAC CATHETERIZATION  Addendum Date: 06/11/2019    There is severe left ventricular systolic dysfunction.  The left ventricular ejection fraction is less than 25% by visual estimate.  LV end diastolic pressure is mildly elevated. LVEDP 20 mm Hg.  There is no  aortic valve stenosis.  Ao sat 94%, PA sat 66%, mean PA pressure 18 mm Hg, mean PCWP 15 mm Hg; CO 4.9 L/min; CI 2.5  No angiographically apparent CAD.  Nonischemic cardiomyopathy.  Continue aggressive medical therapy.   Result Date: 06/11/2019  There is severe left ventricular systolic dysfunction.  The left ventricular ejection fraction is less than 25% by visual estimate.  LV end diastolic pressure is mildly elevated. LVEDP 20 mm Hg.  There is no aortic valve stenosis.  Ao sat 94%, PA sat 66%, mean PA pressure 18 mm Hg, mean PCWP 15 mm Hg; CO 4.9 L/min; CI 2.5  Nonischemic cardiomyopathy.  Continue aggressive medical therapy.   Disposition   Patient was seen and examined by Dr. Tamala Julian who deemed patient as stable for discharge. Follow-up has been arranged. Discharge medications as listed below.   Follow-up Plans &  Appointments    Follow-up Information    Tobb, Kardie, DO Follow up on 06/23/2019.   Specialty: Cardiology Why: Please arrive 15 minutes early for your 8:25am post-hospital cardiology follow-up appointment Contact information: [address] [place] [address] [phone]          Discharge Instructions    Amb Referral to Cardiac Rehabilitation   Complete by: As directed    Diagnosis: Heart Failure (see criteria below if ordering Phase II)   Heart Failure Type: Chronic Systolic & Diastolic Comment - only virtual   After initial evaluation and assessments completed: Virtual Based Care may be provided alone or in conjunction with Phase 2 Cardiac Rehab based on patient barriers.: Yes      Discharge Medications   Allergies as of 06/12/2019      Reactions   Latex       Medication List    TAKE these medications   amitriptyline 25 MG tablet Commonly known as: ELAVIL Take 50 mg by mouth at bedtime.   atorvastatin 40 MG tablet Commonly known as: LIPITOR Take 1 tablet (40 mg total) by mouth daily at 6 PM.   carvedilol 3.125 MG tablet Commonly known as:  COREG Take 1 tablet (3.125 mg total) by mouth 2 (two) times daily with a meal.   sacubitril-valsartan 24-26 MG Commonly known as: ENTRESTO Take 1 tablet by mouth 2 (two) times daily.   spironolactone 25 MG tablet Commonly known as: ALDACTONE Take 0.5 tablets (12.5 mg total) by mouth daily. Start taking on: June 13, 2019         Outstanding Labs/Studies   Will need BMET at follow-up visit.   Duration of Discharge Encounter   Greater than 30 minutes including physician time.  Signed, Beatriz Stallion PA-C 06/12/2019, 12:20 PM

## 2019-06-12 NOTE — Progress Notes (Signed)
Progress Note  Patient Name: Krista Jenkins Date of Encounter: 06/12/2019  Primary Cardiologist: Thomasene Ripple, MD  Subjective   She feels better today.  Did not cough as much last night.  Tolerated initial very low doses of guideline directed therapy for systolic dysfunction.  During our conversation she developed left subclavicular discomfort, same location is that which led to transfer.  Plan is to get an EKG now.  Angiography did not demonstrate obstructive disease.  Cannot exclude the possibility of coronary spasm.  Inpatient Medications    Scheduled Meds: . amitriptyline  50 mg Oral QHS  . aspirin EC  81 mg Oral Daily  . atorvastatin  40 mg Oral q1800  . carvedilol  3.125 mg Oral BID WC  . chlorpheniramine-HYDROcodone  5 mL Oral Q12H  . sacubitril-valsartan  1 tablet Oral BID  . sodium chloride flush  3 mL Intravenous Q12H  . sodium chloride flush  3 mL Intravenous Q12H  . spironolactone  12.5 mg Oral Daily   Continuous Infusions: . sodium chloride     PRN Meds: sodium chloride, acetaminophen, ondansetron (ZOFRAN) IV, sodium chloride flush   Vital Signs    Vitals:   06/12/19 0638 06/12/19 0639 06/12/19 0645 06/12/19 0907  BP: 90/66  92/62 104/81  Pulse: 90  97 (!) 103  Resp: 20  20 20   Temp: 97.9 F (36.6 C)  97.7 F (36.5 C) 97.9 F (36.6 C)  TempSrc: Oral  Oral Oral  SpO2: 94%  94% 98%  Weight:  85 kg    Height:        Intake/Output Summary (Last 24 hours) at 06/12/2019 0958 Last data filed at 06/11/2019 2000 Gross per 24 hour  Intake 240 ml  Output 300 ml  Net -60 ml   Last 3 Weights 06/12/2019 06/11/2019 06/10/2019  Weight (lbs) 187 lb 4.8 oz 186 lb 3.2 oz 186 lb 1.6 oz  Weight (kg) 84.959 kg 84.46 kg 84.414 kg      Telemetry    Normal sinus rhythm without ventricular ectopy.- Personally Reviewed  ECG    New tracing is pending- Personally Reviewed   Physical Exam  Stable, in no acute respiratory distress.  Sitting at 90 degrees in her  bed. GEN:  Somewhat pale appearing Neck:  Neck veins are flat Cardiac:  S3 gallop. Respiratory: Clear to auscultation bilaterally. GI: Soft, nontender, non-distended  MS: No edema; No deformity. Neuro:  Nonfocal  Psych: Mood is normal today.  Labs    High Sensitivity Troponin:  No results for input(s): TROPONINIHS in the last 720 hours.    Chemistry Recent Labs  Lab 06/10/19 1715 06/11/19 0931 06/11/19 0941 06/12/19 0437  NA 138 139 139 139  K 3.5 3.9 3.8 4.3  CL 99  --   --  103  CO2 27  --   --  26  GLUCOSE 143*  --   --  109*  BUN 23*  --   --  24*  CREATININE 0.83  --   --  0.86  CALCIUM 9.1  --   --  8.8*  PROT 6.8  --   --   --   ALBUMIN 3.8  --   --   --   AST 23  --   --   --   ALT 42  --   --   --   ALKPHOS 60  --   --   --   BILITOT 0.7  --   --   --  GFRNONAA >60  --   --  >60  GFRAA >60  --   --  >60  ANIONGAP 12  --   --  10     Hematology Recent Labs  Lab 06/10/19 1715 06/11/19 0735 06/11/19 0931 06/11/19 0941 06/12/19 0437  WBC 7.2 5.3  --   --  5.2  RBC 4.66 4.52  --   --  4.72  HGB 13.8 13.7 14.3 14.3 14.1  HCT 42.7 40.6 42.0 42.0 44.5  MCV 91.6 89.8  --   --  94.3  MCH 29.6 30.3  --   --  29.9  MCHC 32.3 33.7  --   --  31.7  RDW 13.5 13.1  --   --  13.1  PLT 254 206  --   --  208    BNP Recent Labs  Lab 06/10/19 1715  BNP 425.0*     DDimer No results for input(s): DDIMER in the last 168 hours.   Radiology    CARDIAC CATHETERIZATION  Addendum Date: 06/11/2019    There is severe left ventricular systolic dysfunction.  The left ventricular ejection fraction is less than 25% by visual estimate.  LV end diastolic pressure is mildly elevated. LVEDP 20 mm Hg.  There is no aortic valve stenosis.  Ao sat 94%, PA sat 66%, mean PA pressure 18 mm Hg, mean PCWP 15 mm Hg; CO 4.9 L/min; CI 2.5  No angiographically apparent CAD.  Nonischemic cardiomyopathy.  Continue aggressive medical therapy.   Result Date: 06/11/2019  There is  severe left ventricular systolic dysfunction.  The left ventricular ejection fraction is less than 25% by visual estimate.  LV end diastolic pressure is mildly elevated. LVEDP 20 mm Hg.  There is no aortic valve stenosis.  Ao sat 94%, PA sat 66%, mean PA pressure 18 mm Hg, mean PCWP 15 mm Hg; CO 4.9 L/min; CI 2.5  Nonischemic cardiomyopathy.  Continue aggressive medical therapy.    Cardiac Studies   Cardiac cath results are noted above.  There is slight elevation in LVEDP.  The therapy is outlined will help to manage.  Patient Profile     58 y.o. female with no significant past medical history transferred from [facility] for "new" onset systolic heart failure (5 to 6 weeks).  Was admitted at [facility] after having chest pain.  Assessment & Plan    1. Systolic heart failure, "new", of unknown duration: Tolerating very low doses of guideline directed therapy: Arni/MRA/BB.  Plan to give full dose Entresto this morning and if tolerates without significant hypotension, discharge later this afternoon. 2. Chest Pain: We will get EKG now to rule out coronary artery spasm. 3. Cough: Improved with heart failure therapy.  If she tolerates guideline directed therapy after her a.m. doses of medication today, will discharge and she should have follow-up with Dr. Harriet Masson.  (7 to 10 days).  Anticipated discharge therapy will be spironolactone 12.5 mg daily, Entresto 24/26 mg twice daily, and carvedilol 3.125 mg p.o. twice daily.  I would anticipate benefit from a cardiac MRI with reference to possible explanations for her idiopathic cardiomyopathy.  This can be done as an outpatient.  She should have lab work done in 1 week.  She should have a follow-up office appointment in 7 to 10 days.  For questions or updates, please contact [place] Please consult [email or web address] for contact info under        Signed, Sinclair Grooms, MD  06/12/2019, 9:58 AM  \

## 2019-06-12 NOTE — Progress Notes (Signed)
Notified of patient chest pain. RN to bedside at  1011. Patient stated mid chest pain that had passed. Upon inquiry, stated not crushing pain just "hurt."   EKG complete at 1022. Paged [facility] at 1026. EKG complete. Patient stated pain has passed, did not describe crushing pain just "hurt."  Paged [facility] at 1241  Patient BP 91/62 (72) HR 95. Ok to give [place]?. Per Kroeger upon callback, informed to hold and will review meds before discharge. Paged [facility] at approx [phone] to confirm to be discharged without Entresto, upon callback confirmed to be discharged without Entresto   Paged [facility] at [phone]. Pt inquiring about cough med for discharge? Want more info about stage of heart failure before discharge?   Orders for d/c with cough med; prescription given.

## 2019-06-12 NOTE — Discharge Instructions (Signed)
Heart Failure, Diagnosis  Heart failure means that your heart is not able to pump blood in the right way. This makes it hard for your body to work well. Heart failure is usually a long-term (chronic) condition. You must take good care of yourself and follow your treatment plan from your doctor. What are the causes? This condition may be caused by:  High blood pressure.  Build up of cholesterol and fat in the arteries.  Heart attack. This injures the heart muscle.  Heart valves that do not open and close properly.  Damage of the heart muscle. This is also called cardiomyopathy.  Lung disease.  Abnormal heart rhythms. What increases the risk? The risk of heart failure goes up as a person ages. This condition is also more likely to develop in people who:  Are overweight.  Are female.  Smoke or chew tobacco.  Abuse alcohol or illegal drugs.  Have taken medicines that can damage the heart.  Have diabetes.  Have abnormal heart rhythms.  Have thyroid problems.  Have low blood counts (anemia). What are the signs or symptoms? Symptoms of this condition include:  Shortness of breath.  Coughing.  Swelling of the feet, ankles, legs, or belly.  Losing weight for no reason.  Trouble breathing.  Waking from sleep because of the need to sit up and get more air.  Rapid heartbeat.  Being very tired.  Feeling dizzy, or feeling like you may pass out (faint).  Having no desire to eat.  Feeling like you may vomit (nauseous).  Peeing (urinating) more at night.  Feeling confused. How is this treated?     This condition may be treated with:  Medicines. These can be given to treat blood pressure and to make the heart muscles stronger.  Changes in your daily life. These may include eating a healthy diet, staying at a healthy body weight, quitting tobacco and illegal drug use, or doing exercises.  Surgery. Surgery can be done to open blocked valves, or to put  devices in the heart, such as pacemakers.  A donor heart (heart transplant). You will receive a healthy heart from a donor. Follow these instructions at home:  Treat other conditions as told by your doctor. These may include high blood pressure, diabetes, thyroid disease, or abnormal heart rhythms.  Learn as much as you can about heart failure.  Get support as you need it.  Keep all follow-up visits as told by your doctor. This is important. Summary  Heart failure means that your heart is not able to pump blood in the right way.  This condition is caused by high blood pressure, heart attack, or damage of the heart muscle.  Symptoms of this condition include shortness of breath and swelling of the feet, ankles, legs, or belly. You may also feel very tired or feel like you may vomit.  You may be treated with medicines, surgery, or changes in your daily life.  Treat other health conditions as told by your doctor. This information is not intended to replace advice given to you by your health care provider. Make sure you discuss any questions you have with your health care provider. Document Released: 03/20/2008 Document Revised: 08/29/2018 Document Reviewed: 08/29/2018 Elsevier Patient Education  [employer].   Heart Failure, Self Care Heart failure is a serious condition. This sheet explains things you need to do to take care of yourself at home. To help you stay as healthy as possible, you may be asked to  change your diet, take certain medicines, and make other changes in your life. Your doctor may also give you more specific instructions. If you have problems or questions, call your doctor. What are the risks? Having heart failure makes it more likely for you to have some problems. These problems can get worse if you do not take good care of yourself. Problems may include:  Blood clotting problems. This may cause a stroke.  Damage to the kidneys, liver, or lungs.  Abnormal  heart rhythms. Supplies needed:  Scale for weighing yourself.  Blood pressure monitor.  Notebook.  Medicines. How to care for yourself when you have heart failure Medicines Take over-the-counter and prescription medicines only as told by your doctor. Take your medicines every day.  Do not stop taking your medicine unless your doctor tells you to do so.  Do not skip any medicines.  Get your prescriptions refilled before you run out of medicine. This is important. Eating and drinking   Eat heart-healthy foods. Talk with a diet specialist (dietitian) to create an eating plan.  Choose foods that: ? Have no trans fat. ? Are low in saturated fat and cholesterol.  Choose healthy foods, such as: ? Fresh or frozen fruits and vegetables. ? Fish. ? Low-fat (lean) meats. ? Legumes, such as beans, peas, and lentils. ? Fat-free or low-fat dairy products. ? Whole-grain foods. ? High-fiber foods.  Limit salt (sodium) if told by your doctor. Ask your diet specialist to tell you which seasonings are healthy for your heart.  Cook in healthy ways instead of frying. Healthy ways of cooking include roasting, grilling, broiling, baking, poaching, steaming, and stir-frying.  Limit how much fluid you drink, if told by your doctor. Alcohol use  Do not drink alcohol if: ? Your doctor tells you not to drink. ? Your heart was damaged by alcohol, or you have very bad heart failure. ? You are pregnant, may be pregnant, or are planning to become pregnant.  If you drink alcohol: ? Limit how much you use to:  0-1 drink a day for women.  0-2 drinks a day for men. ? Be aware of how much alcohol is in your drink. In the [place] one drink equals one 12 oz bottle of beer (355 mL), one 5 oz glass of wine (148 mL), or one 1 oz glass of hard liquor (44 mL). Lifestyle   Do not use any products that contain nicotine or tobacco, such as cigarettes, e-cigarettes, and chewing tobacco. If you need help  quitting, ask your doctor. ? Do not use nicotine gum or patches before talking to your doctor.  Do not use illegal drugs.  Lose weight if told by your doctor.  Do physical activity if told by your doctor. Talk to your doctor before you begin an exercise if: ? You are an older adult. ? You have very bad heart failure.  Learn to manage stress. If you need help, ask your doctor.  Get rehab (rehabilitation) to help you stay independent and to help with your quality of life.  Plan time to rest when you get tired. Check weight and blood pressure   Weigh yourself every day. This will help you to know if fluid is building up in your body. ? Weigh yourself every morning after you pee (urinate) and before you eat breakfast. ? Wear the same amount of clothing each time. ? Write down your daily weight. Give your record to your doctor.  Check and write down your blood  pressure as told by your doctor.  Check your pulse as told by your doctor. Dealing with very hot and very cold weather  If it is very hot: ? Avoid activities that take a lot of energy. ? Use air conditioning or fans, or find a cooler place. ? Avoid caffeine and alcohol. ? Wear clothing that is loose-fitting, lightweight, and light-colored.  If it is very cold: ? Avoid activities that take a lot of energy. ? Layer your clothes. ? Wear mittens or gloves, a hat, and a scarf when you go outside. ? Avoid alcohol. Follow these instructions at home:  Stay up to date with shots (vaccines). Get pneumococcal and flu (influenza) shots.  Keep all follow-up visits as told by your doctor. This is important. Contact a doctor if:  You gain weight quickly.  You have increasing shortness of breath.  You cannot do your normal activities.  You get tired easily.  You cough a lot.  You don't feel like eating or feel like you may vomit (nauseous).  You become puffy (swell) in your hands, feet, ankles, or belly (abdomen).  You  cannot sleep well because it is hard to breathe.  You feel like your heart is beating fast (palpitations).  You get dizzy when you stand up. Get help right away if:  You have trouble breathing.  You or someone else notices a change in your behavior, such as having trouble staying awake.  You have chest pain or discomfort.  You pass out (faint). These symptoms may be an emergency. Do not wait to see if the symptoms will go away. Get medical help right away. Call your local emergency services (911 in the [place] Do not drive yourself to the hospital. Summary  Heart failure is a serious condition. To care for yourself, you may have to change your diet, take medicines, and make other lifestyle changes.  Take your medicines every day. Do not stop taking them unless your doctor tells you to do so.  Eat heart-healthy foods, such as fresh or frozen fruits and vegetables, fish, lean meats, legumes, fat-free or low-fat dairy products, and whole-grain or high-fiber foods.  Ask your doctor if you can drink alcohol. You may have to stop alcohol use if you have very bad heart failure.  Contact your doctor if you gain weight quickly or feel that your heart is beating too fast. Get help right away if you pass out, or have chest pain or trouble breathing. This information is not intended to replace advice given to you by your health care provider. Make sure you discuss any questions you have with your health care provider. Document Released: 09/24/2018 Document Revised: 09/23/2018 Document Reviewed: 09/24/2018 Elsevier Patient Education  2020 Else[employer] Inc.   Heart Failure, Self Care Heart failure is a serious condition. This sheet explains things you need to do to take care of yourself at home. To help you stay as healthy as possible, you may be asked to change your diet, take certain medicines, and make other changes in your life. Your doctor may also give you more specific instructions. If you have  problems or questions, call your doctor. What are the risks? Having heart failure makes it more likely for you to have some problems. These problems can get worse if you do not take good care of yourself. Problems may include:  Blood clotting problems. This may cause a stroke.  Damage to the kidneys, liver, or lungs.  Abnormal heart rhythms. Supplies needed:  Scale  for weighing yourself.  Blood pressure monitor.  Notebook.  Medicines. How to care for yourself when you have heart failure Medicines Take over-the-counter and prescription medicines only as told by your doctor. Take your medicines every day.  Do not stop taking your medicine unless your doctor tells you to do so.  Do not skip any medicines.  Get your prescriptions refilled before you run out of medicine. This is important. Eating and drinking   Eat heart-healthy foods. Talk with a diet specialist (dietitian) to create an eating plan.  Choose foods that: ? Have no trans fat. ? Are low in saturated fat and cholesterol.  Choose healthy foods, such as: ? Fresh or frozen fruits and vegetables. ? Fish. ? Low-fat (lean) meats. ? Legumes, such as beans, peas, and lentils. ? Fat-free or low-fat dairy products. ? Whole-grain foods. ? High-fiber foods.  Limit salt (sodium) if told by your doctor. Ask your diet specialist to tell you which seasonings are healthy for your heart.  Cook in healthy ways instead of frying. Healthy ways of cooking include roasting, grilling, broiling, baking, poaching, steaming, and stir-frying.  Limit how much fluid you drink, if told by your doctor. Alcohol use  Do not drink alcohol if: ? Your doctor tells you not to drink. ? Your heart was damaged by alcohol, or you have very bad heart failure. ? You are pregnant, may be pregnant, or are planning to become pregnant.  If you drink alcohol: ? Limit how much you use to:  0-1 drink a day for women.  0-2 drinks a day for  men. ? Be aware of how much alcohol is in your drink. In the [place] one drink equals one 12 oz bottle of beer (355 mL), one 5 oz glass of wine (148 mL), or one 1 oz glass of hard liquor (44 mL). Lifestyle   Do not use any products that contain nicotine or tobacco, such as cigarettes, e-cigarettes, and chewing tobacco. If you need help quitting, ask your doctor. ? Do not use nicotine gum or patches before talking to your doctor.  Do not use illegal drugs.  Lose weight if told by your doctor.  Do physical activity if told by your doctor. Talk to your doctor before you begin an exercise if: ? You are an older adult. ? You have very bad heart failure.  Learn to manage stress. If you need help, ask your doctor.  Get rehab (rehabilitation) to help you stay independent and to help with your quality of life.  Plan time to rest when you get tired. Check weight and blood pressure   Weigh yourself every day. This will help you to know if fluid is building up in your body. ? Weigh yourself every morning after you pee (urinate) and before you eat breakfast. ? Wear the same amount of clothing each time. ? Write down your daily weight. Give your record to your doctor.  Check and write down your blood pressure as told by your doctor.  Check your pulse as told by your doctor. Dealing with very hot and very cold weather  If it is very hot: ? Avoid activities that take a lot of energy. ? Use air conditioning or fans, or find a cooler place. ? Avoid caffeine and alcohol. ? Wear clothing that is loose-fitting, lightweight, and light-colored.  If it is very cold: ? Avoid activities that take a lot of energy. ? Layer your clothes. ? Wear mittens or gloves, a hat, and a  scarf when you go outside. ? Avoid alcohol. Follow these instructions at home:  Stay up to date with shots (vaccines). Get pneumococcal and flu (influenza) shots.  Keep all follow-up visits as told by your doctor. This is  important. Contact a doctor if:  You gain weight quickly.  You have increasing shortness of breath.  You cannot do your normal activities.  You get tired easily.  You cough a lot.  You don't feel like eating or feel like you may vomit (nauseous).  You become puffy (swell) in your hands, feet, ankles, or belly (abdomen).  You cannot sleep well because it is hard to breathe.  You feel like your heart is beating fast (palpitations).  You get dizzy when you stand up. Get help right away if:  You have trouble breathing.  You or someone else notices a change in your behavior, such as having trouble staying awake.  You have chest pain or discomfort.  You pass out (faint). These symptoms may be an emergency. Do not wait to see if the symptoms will go away. Get medical help right away. Call your local emergency services (911 in the [place] Do not drive yourself to the hospital. Summary  Heart failure is a serious condition. To care for yourself, you may have to change your diet, take medicines, and make other lifestyle changes.  Take your medicines every day. Do not stop taking them unless your doctor tells you to do so.  Eat heart-healthy foods, such as fresh or frozen fruits and vegetables, fish, lean meats, legumes, fat-free or low-fat dairy products, and whole-grain or high-fiber foods.  Ask your doctor if you can drink alcohol. You may have to stop alcohol use if you have very bad heart failure.  Contact your doctor if you gain weight quickly or feel that your heart is beating too fast. Get help right away if you pass out, or have chest pain or trouble breathing. This information is not intended to replace advice given to you by your health care provider. Make sure you discuss any questions you have with your health care provider. Document Released: 09/24/2018 Document Revised: 09/23/2018 Document Reviewed: 09/24/2018 Elsevier Patient Education  2020 Elsevier Inc. PLEASE  REMEMBER TO BRING ALL OF YOUR MEDICATIONS TO EACH OF YOUR FOLLOW-UP OFFICE VISITS.  PLEASE ATTEND ALL SCHEDULED FOLLOW-UP APPOINTMENTS.   Activity: Increase activity slowly as tolerated. You may shower, but no soaking baths (or swimming) for 1 week. No driving for 24 hours. No lifting over 5 lbs for 1 week. No sexual activity for 1 week.   You May Return to Work: in 1 week (if applicable)  Wound Care: You may wash cath site gently with soap and water. Keep cath site clean and dry. If you notice pain, swelling, bleeding or pus at your cath site, please call [phone].

## 2019-06-15 ENCOUNTER — Telehealth: Payer: Self-pay | Admitting: Cardiology

## 2019-06-15 NOTE — Telephone Encounter (Signed)
Patient called and she saw Dr. Harriet Masson in the hospital last week and she is having pain in her shoulder blades. When she takes 2 apsirins it goes away.  I mentioned to her if it got worse before the RN called back to go to the ER.  Patients spouse stated that it was not an emergency situation.

## 2019-06-15 NOTE — Telephone Encounter (Signed)
Telephone call to patient. States had some back pain in her shoulders that went away with Tylenol. Reviewed her  cardiac cath from 06/11/19 which showed no blockages. Denies pain now and denies SOB. Informed her that it is probably not her heart but if it continues despite tylenol or she has pain in her L arm or chest or SOB to go to the Emergency Room. Pt verbalized understanding.

## 2019-06-17 ENCOUNTER — Telehealth (HOSPITAL_COMMUNITY): Payer: Self-pay

## 2019-06-17 NOTE — Telephone Encounter (Signed)
Attempted to call patient in regards to Cardiac Rehab - LM on VM 

## 2019-06-17 NOTE — Telephone Encounter (Signed)
Pt called CR back, adv pt that given the surge/increase in Covid-19 cases and hospitalizations our [facility] leadership has asked [place] to halt onsite Cardiac and Pulmonary rehab exercise sessions and move patients to the Virtual app we have with [facility]. The reason for this is so that department staff can be deployed to the Inpatient Nursing floors to assist those teams in need. Leadership anticipates this need will be 30-60 days however it could be extended if needed.   Patient expressed interest, explained scheduling process. Will contact patient for scheduling once f/u has been completed. Pt verbalized understanding and is in agreement.

## 2019-06-23 ENCOUNTER — Other Ambulatory Visit: Payer: Self-pay

## 2019-06-23 ENCOUNTER — Ambulatory Visit (INDEPENDENT_AMBULATORY_CARE_PROVIDER_SITE_OTHER): Payer: Self-pay | Admitting: Cardiology

## 2019-06-23 ENCOUNTER — Encounter: Payer: Self-pay | Admitting: Cardiology

## 2019-06-23 VITALS — BP 122/90 | HR 78 | Ht 66.0 in | Wt 187.6 lb

## 2019-06-23 DIAGNOSIS — E782 Mixed hyperlipidemia: Secondary | ICD-10-CM

## 2019-06-23 DIAGNOSIS — I5020 Unspecified systolic (congestive) heart failure: Secondary | ICD-10-CM

## 2019-06-23 DIAGNOSIS — I428 Other cardiomyopathies: Secondary | ICD-10-CM

## 2019-06-23 LAB — BASIC METABOLIC PANEL
BUN/Creatinine Ratio: 24 — ABNORMAL HIGH (ref 9–23)
BUN: 22 mg/dL (ref 6–24)
CO2: 21 mmol/L (ref 20–29)
Calcium: 9.4 mg/dL (ref 8.7–10.2)
Chloride: 102 mmol/L (ref 96–106)
Creatinine, Ser: 0.91 mg/dL (ref 0.57–1.00)
GFR calc Af Amer: 80 mL/min/{1.73_m2} (ref >59)
GFR calc non Af Amer: 70 mL/min/{1.73_m2} (ref >59)
Glucose: 103 mg/dL — ABNORMAL HIGH (ref 65–99)
Potassium: 4.8 mmol/L (ref 3.5–5.2)
Sodium: 137 mmol/L (ref 134–144)

## 2019-06-23 LAB — MAGNESIUM: Magnesium: 2.1 mg/dL (ref 1.6–2.3)

## 2019-06-23 MED ORDER — ENTRESTO 24-26 MG PO TABS: 1.0000 | ORAL_TABLET | Freq: Two times a day (BID) | ORAL | 5 refills | Status: DC

## 2019-06-23 NOTE — Patient Instructions (Addendum)
Medication Instructions:  Your physician has recommended you make the following change in your medication:   START: Entresto 24/26 Take 1 tab Twice daily  *If you need a refill on your cardiac medications before your next appointment, please call your pharmacy*  Lab Work: Your physician recommends that you return for lab work in: TODAY  BMP,Magnesium    If you have labs (blood work) drawn today and your tests are completely normal, you will receive your results only by: [facility] Kitchen MyChart Message (if you have MyChart) OR . A paper copy in the mail If you have any lab test that is abnormal or we need to change your treatment, we will call you to review the results.  Testing/Procedures: Your physician has requested that you have an echocardiogram. Echocardiography is a painless test that uses sound waves to create images of your heart. It provides your doctor with information about the size and shape of your heart and how well your heart's chambers and valves are working. This procedure takes approximately one hour. There are no restrictions for this procedure.  Your physician has requested that you have a cardiac MRI. Cardiac MRI uses a computer to create images of your heart as its beating, producing both still and moving pictures of your heart and major blood vessels. For further information please visit [email or web address] Please follow the instruction sheet given to you today for more information.   Follow-Up: At [facility], you and your health needs are our priority.  As part of our continuing mission to provide you with exceptional heart care, we have created designated Provider Care Teams.  These Care Teams include your primary Cardiologist (physician) and Advanced Practice Providers (APPs -  Physician Assistants and Nurse Practitioners) who all work together to provide you with the care you need, when you need it.  Your next appointment:   1 month(s)  The format for your next  appointment:   In Person  Provider:   Berniece Salines, DO  Other Instructions You are being referred to [facility]. The will call you with dates and times   You are being referred for for a LIfe Vest. They will call you with a time to set up the vest.

## 2019-06-23 NOTE — Progress Notes (Signed)
Cardiology Office Note:    Date:  06/23/2019   ID:  [name], DOB Dec 30, 1960, MRN [id number]  PCP:  [name], MD  Cardiologist:  Thomasene Ripple, DO  Electrophysiologist:  None   Referring MD: [name], MD   To establish cardiac care  History of Present Illness:    Zerah Hilyer is a 58 y.o. female with a hx of with no reported history presented initially on June 09, 2019 to the [facility] to be evaluated for cough and shortness of breath.  Prior to her presentation the patient reported that she has been treated for pneumonia 2 weeks prior.  While in the hospital she did get a CT of the chest which was negative for pulmonary embolism but showed moderate pulmonary congestion.  She was started on heart failure treatment and an echocardiogram was performed which show depressed ejection fraction of 20 to 25%.  Giving this new depressed ejection fraction and anginal pain the patient was transferred to [facility] for left heart catheterization.  While at [facility] she did undergo left heart catheterization which showed normal coronaries.  Left ventriculogram did show evidence of ejection fraction less than 25% visually.  Pulmonary capillary wedge pressure was 15 mmHg and a mean pulmonary pressure was 18 mmHg, cardiac output 4.9 L with a cardiac index of 2.5.  The patient was subsequently discharged on labetalol 3.125 mg, spironolactone 12.5 mg and atorvastatin 40 mg due to LDL of 135.  She is here today to establish cardiac care.  She tells me that she has had generalized fatigue since her discharge but her cough has resolved.  He denies any chest pain but tells me that has been experiencing some shoulder pain when she uses her left arm.  No other complaints at this time.  History reviewed. No pertinent past medical history.  Past Surgical History:  Procedure Laterality Date  . CESAREAN SECTION    . RIGHT/LEFT HEART CATH AND CORONARY ANGIOGRAPHY N/A  06/11/2019   Procedure: RIGHT/LEFT HEART CATH AND CORONARY ANGIOGRAPHY;  Surgeon: Corky Crafts, MD;  Location: [facility] INVASIVE CV LAB;  Service: Cardiovascular;  Laterality: N/A;    Current Medications: Current Meds  Medication Sig  . amitriptyline (ELAVIL) 25 MG tablet Take 50 mg by mouth at bedtime.  Marland Kitchen atorvastatin (LIPITOR) 40 MG tablet Take 1 tablet (40 mg total) by mouth daily at 6 PM.  . carvedilol (COREG) 3.125 MG tablet Take 1 tablet (3.125 mg total) by mouth 2 (two) times daily with a meal.  . chlorpheniramine-HYDROcodone (TUSSIONEX) 10-8 MG/5ML SUER Take 5 mLs by mouth every 12 (twelve) hours as needed for cough.  Marland Kitchen spironolactone (ALDACTONE) 25 MG tablet Take 0.5 tablets (12.5 mg total) by mouth daily.     Allergies:   Latex   Social History   Socioeconomic History  . Marital status: Unknown    Spouse name: Not on file  . Number of children: Not on file  . Years of education: Not on file  . Highest education level: Not on file  Occupational History  . Not on file  Tobacco Use  . Smoking status: Former Smoker  Substance and Sexual Activity  . Alcohol use: Not Currently  . Drug use: Not on file  . Sexual activity: Not on file  Other Topics Concern  . Not on file  Social History Narrative  . Not on file   Social Determinants of Health   Financial Resource Strain:   . Difficulty of  Paying Living Expenses: Not on file  Food Insecurity:   . Worried About Programme researcher, broadcasting/film/video in the Last Year: Not on file  . Ran Out of Food in the Last Year: Not on file  Transportation Needs:   . Lack of Transportation (Medical): Not on file  . Lack of Transportation (Non-Medical): Not on file  Physical Activity:   . Days of Exercise per Week: Not on file  . Minutes of Exercise per Session: Not on file  Stress:   . Feeling of Stress : Not on file  Social Connections:   . Frequency of Communication with Friends and Family: Not on file  . Frequency of Social Gatherings with  Friends and Family: Not on file  . Attends Religious Services: Not on file  . Active Member of Clubs or Organizations: Not on file  . Attends Banker Meetings: Not on file  . Marital Status: Not on file     Family History: The patient's family history includes CAD in her father; Hypertension in her brother.  ROS:   Review of Systems  Constitution: Negative for decreased appetite, fever and weight gain.  HENT: Negative for congestion, ear discharge, hoarse voice and sore throat.   Eyes: Negative for discharge, redness, vision loss in right eye and visual halos.  Cardiovascular: Negative for chest pain, dyspnea on exertion, leg swelling, orthopnea and palpitations.  Respiratory: Negative for cough, hemoptysis, shortness of breath and snoring.   Endocrine: Negative for heat intolerance and polyphagia.  Hematologic/Lymphatic: Negative for bleeding problem. Does not bruise/bleed easily.  Skin: Negative for flushing, nail changes, rash and suspicious lesions.  Musculoskeletal: Negative for arthritis, joint pain, muscle cramps, myalgias, neck pain and stiffness.  Gastrointestinal: Negative for abdominal pain, bowel incontinence, diarrhea and excessive appetite.  Genitourinary: Negative for decreased libido, genital sores and incomplete emptying.  Neurological: Negative for brief paralysis, focal weakness, headaches and loss of balance.  Psychiatric/Behavioral: Negative for altered mental status, depression and suicidal ideas.  Allergic/Immunologic: Negative for HIV exposure and persistent infections.    EKGs/Labs/Other Studies Reviewed:    The following studies were reviewed today:   EKG:   none today.  EKG on 1218 showed evidence of normal sinus rhythm, heart rate 90 bpm  Echocardiogram performed at [facility] on 06/10/2019 Left ventricle was severely dilated.  Overall left ventricular ejection fraction severely depressed 2025%.  The right ventricle was normal size.   Left atrium was mildly dilated.  Mild aortic valve sclerosis.  Mild to moderate mitral regurgitation.  Tricuspid valve was normal.  Trace pulmonic regurgitation.  Left heart catheterization  There is severe left ventricular systolic dysfunction.  The left ventricular ejection fraction is less than 25% by visual estimate.  LV end diastolic pressure is mildly elevated. LVEDP 20 mm Hg.  There is no aortic valve stenosis.  Ao sat 94%, PA sat 66%, mean PA pressure 18 mm Hg, mean PCWP 15 mm Hg; CO 4.9 L/min; CI 2.5  No angiographically apparent CAD.  Nonischemic cardiomyopathy.  Continue aggressive medical therapy.   Recent Labs: 06/10/2019: ALT 42; B Natriuretic Peptide 425.0; TSH 2.607 06/12/2019: BUN 24; Creatinine, Ser 0.86; Hemoglobin 14.1; Platelets 208; Potassium 4.3; Sodium 139  Recent Lipid Panel No results found for: CHOL, TRIG, HDL, CHOLHDL, VLDL, LDLCALC, LDLDIRECT  Physical Exam:    VS:  BP 122/90   Pulse 78   Ht 5\' 6"  (1.676 m)   Wt 187 lb 9.6 oz (85.1 kg)   LMP  (LMP Unknown)  SpO2 99%   BMI 30.28 kg/m     Wt Readings from Last 3 Encounters:  06/23/19 187 lb 9.6 oz (85.1 kg)  06/12/19 187 lb 4.8 oz (85 kg)     GEN: Well nourished, well developed in no acute distress HEENT: Normal NECK: No JVD; No carotid bruits LYMPHATICS: No lymphadenopathy CARDIAC: S1S2 noted,RRR, no murmurs, rubs, gallops RESPIRATORY:  Clear to auscultation without rales, wheezing or rhonchi  ABDOMEN: Soft, non-tender, non-distended, +bowel sounds, no guarding. EXTREMITIES: No edema, No cyanosis, no clubbing MUSCULOSKELETAL:  No edema; No deformity  SKIN: Warm and dry NEUROLOGIC:  Alert and oriented x 3, non-focal PSYCHIATRIC:  Normal affect, good insight  ASSESSMENT:    1. Heart failure with reduced ejection fraction (HCC)   2. Nonischemic cardiomyopathy (HCC)   3. Mixed hyperlipidemia    PLAN:     1.  Nonischemic card myopathy, the patient recent left heart cath shows normal  coronaries.  There is still more questions to be answered about her etiology of her severely depressed ejection fraction.  With this her cardiac MRI would be appropriate, therefore ordering a cardiac MRI which hopefully will be scheduled as soon as available.  In addition I have discussed with the patient and her husband about wearing a LifeVest to protect her from ventricular arrhythmias until repeat echocardiogram which will be scheduled in March.  She will benefit from cardiac rehab at this time she [facility] because there she would have a zoom option to be able to stay at home and do her exercises.  In terms of her heart failure with reduced ejection fraction the patient share her weights with me and her dry weight is 183 pounds.  She does appear to be euvolemic at this time.  She is currently on carvedilol 3.125 mg twice a day, spironolactone 12.5 mg daily.  Her blood pressure in the office today is manually 122/90 mmHg, therefore I am going to start her on low-dose Entresto I am really hoping that her blood pressure will be able to tolerate this to keep her on this medication.  We will titrate her medications as appropriate based on tolerance of her blood pressure.  Hopefully we can start her on Farxiga at her next visit which will be in a month.  Hyperlipidemia-continue patient on her atorvastatin 40 mg daily.  Most recent LDL was 135 which was aspirin improvement from September 2019 which is 180.  The patient and her husband who was on the phone [phone]) is in agreement with the above plan. The patient left the office in stable condition.  The patient will follow up in 1 month.   Medication Adjustments/Labs and Tests Ordered: Current medicines are reviewed at length with the patient today.  Concerns regarding medicines are outlined above.  Orders Placed This Encounter  Procedures  . MR Card Morphology Wo/W Cm  . AMB referral to cardiac rehabilitation  . ECHOCARDIOGRAM  COMPLETE   Meds ordered this encounter  Medications  . sacubitril-valsartan (ENTRESTO) 24-26 MG    Sig: Take 1 tablet by mouth 2 (two) times daily.    Dispense:  60 tablet    Refill:  5    Patient Instructions  Medication Instructions:  Your physician has recommended you make the following change in your medication:   START: Entresto 24/26 Take 1 tab Twice daily  *If you need a refill on your cardiac medications before your next appointment, please call your pharmacy*  Lab Work: NONE  If you have  labs (blood work) drawn today and your tests are completely normal, you will receive your results only by: Marland Kitchen. MyChart Message (if you have MyChart) OR . A paper copy in the mail If you have any lab test that is abnormal or we need to change your treatment, we will call you to review the results.  Testing/Procedures: Your physician has requested that you have an echocardiogram. Echocardiography is a painless test that uses sound waves to create images of your heart. It provides your doctor with information about the size and shape of your heart and how well your heart's chambers and valves are working. This procedure takes approximately one hour. There are no restrictions for this procedure.  Your physician has requested that you have a cardiac MRI. Cardiac MRI uses a computer to create images of your heart as its beating, producing both still and moving pictures of your heart and major blood vessels. For further information please visit [email or web address]. Please follow the instruction sheet given to you today for more information.   Follow-Up: At [facility], you and your health needs are our priority.  As part of our continuing mission to provide you with exceptional heart care, we have created designated Provider Care Teams.  These Care Teams include your primary Cardiologist (physician) and Advanced Practice Providers (APPs -  Physician Assistants and Nurse Practitioners) who all work  together to provide you with the care you need, when you need it.  Your next appointment:   1 month(s)  The format for your next appointment:   In Person  Provider:   Thomasene RippleKardie Mindie Rawdon, DO  Other Instructions You are being referred to [facility]. The will call you with dates and times   You are being referred for for a LIfe Vest. They will call you with a time to set up the vest.     Adopting a Healthy Lifestyle.  Know what a healthy weight is for you (roughly BMI <25) and aim to maintain this   Aim for 7+ servings of fruits and vegetables daily   65-80+ fluid ounces of water or unsweet tea for healthy kidneys   Limit to max 1 drink of alcohol per day; avoid smoking/tobacco   Limit animal fats in diet for cholesterol and heart health - choose grass fed whenever available   Avoid highly processed foods, and foods high in saturated/trans fats   Aim for low stress - take time to unwind and care for your mental health   Aim for 150 min of moderate intensity exercise weekly for heart health, and weights twice weekly for bone health   Aim for 7-9 hours of sleep daily   When it comes to diets, agreement about the perfect plan isnt easy to find, even among the experts. Experts at the [employer] developed an idea known as the Healthy Eating Plate. Just imagine a plate divided into logical, healthy portions.   The emphasis is on diet quality:   Load up on vegetables and fruits - one-half of your plate: Aim for color and variety, and remember that potatoes dont count.   Go for whole grains - one-quarter of your plate: Whole wheat, barley, wheat berries, quinoa, oats, brown rice, and foods made with them. If you want pasta, go with whole wheat pasta.   Protein power - one-quarter of your plate: Fish, chicken, beans, and nuts are all healthy, versatile protein sources. Limit red meat.   The diet, however, does go beyond  the plate, offering a few other  suggestions.   Use healthy plant oils, such as olive, canola, soy, corn, sunflower and peanut. Check the labels, and avoid partially hydrogenated oil, which have unhealthy trans fats.   If youre thirsty, drink water. Coffee and tea are good in moderation, but skip sugary drinks and limit milk and dairy products to one or two daily servings.   The type of carbohydrate in the diet is more important than the amount. Some sources of carbohydrates, such as vegetables, fruits, whole grains, and beans-are healthier than others.   Finally, stay active  Signed, Berniece Salines, DO  06/23/2019 9:15 AM    Krista Jenkins

## 2019-06-29 ENCOUNTER — Encounter: Payer: Self-pay | Admitting: Cardiology

## 2019-06-29 ENCOUNTER — Telehealth: Payer: Self-pay | Admitting: *Deleted

## 2019-06-29 NOTE — Telephone Encounter (Signed)
Left message for patient to call regarding appointment for Cardiac MRI scheduled Wednesday 07/15/19 at 11:00 [facility]---arrival time 10:15 am --1st floor admissions office.  I will mail this information to the patient and it is also available in My Chart.

## 2019-07-06 ENCOUNTER — Telehealth (HOSPITAL_COMMUNITY): Payer: Self-pay | Admitting: Nephrology

## 2019-07-07 ENCOUNTER — Telehealth: Payer: Self-pay | Admitting: Cardiology

## 2019-07-07 MED ORDER — LORAZEPAM 1 MG PO TABS: ORAL_TABLET | ORAL | 0 refills | Status: DC

## 2019-07-07 NOTE — Addendum Note (Signed)
Addended by: Fayrene Fearing B on: 07/07/2019 03:09 PM   Modules accepted: Orders

## 2019-07-07 NOTE — Telephone Encounter (Signed)
Dr. [name] referred her to Cardiac Rehab at [facility] and hse has not heard from them, also she needs something called in for her to have the MRI she idoes nto do well I the tubes she states.Krista Jenkins

## 2019-07-07 NOTE — Telephone Encounter (Signed)
Telephone call to patient. Informed her that cardiac rehab in person has been suspended due to COVID and unsupervised exercise is considered dangerous at this point. Dr [name] did agree to give a 1 time dose of Lorazepam 1 mg for prior to MRI.

## 2019-07-13 ENCOUNTER — Telehealth (HOSPITAL_COMMUNITY): Payer: Self-pay | Admitting: *Deleted

## 2019-07-13 NOTE — Telephone Encounter (Addendum)
Pt called regarding scheduling for CR.  Pt to schedule when cleared by her Cardiologist - Dr. [name]. Pt has a lifevest now and is scheduled for  cardiac [facility] on this [facility].  Will confer with MD after the completion of this for readiness to proceed with virtual cardiac rehab.  Pt remains interested in participating virtually and is aware of a cardiac rehab program at [place]. Reinforced to pt exercise guidelines and stopsigns for exercise.  Pt had not advanced her exercise from 5 minutes three times a day.  Advised pt that she should increase by one minute each day as tolerated.  Pt is using treadmill with no incline and she is active around the home that has many stairs. Minor complaints of shortness of breath noted that resolves with rest.Brantly Kalman Les Pou RN, BSN Cardiac and Pulmonary Rehab Nurse Navigator

## 2019-07-14 ENCOUNTER — Telehealth (HOSPITAL_COMMUNITY): Payer: Self-pay | Admitting: Emergency Medicine

## 2019-07-14 ENCOUNTER — Encounter (HOSPITAL_COMMUNITY): Payer: Self-pay

## 2019-07-14 NOTE — Telephone Encounter (Signed)
Reaching out to patient to offer assistance regarding upcoming cardiac imaging study; pt verbalizes understanding of appt date/time, parking situation and where to check in, and verified current allergies; name and call back number provided for further questions should they arise Rockwell [place] RN Navigator Cardiac Imaging [facility] [phone] office [phone] cell  Pt given 1mg  ativan to take prior to MRI, husband driving to/from appt

## 2019-07-15 ENCOUNTER — Other Ambulatory Visit: Payer: Self-pay

## 2019-07-15 ENCOUNTER — Ambulatory Visit (HOSPITAL_COMMUNITY)
Admission: RE | Admit: 2019-07-15 | Discharge: 2019-07-15 | Disposition: A | Discharge status: Home / Self Care | Payer: Self-pay | Source: Ambulatory Visit | Attending: Cardiology | Admitting: Cardiology

## 2019-07-15 DIAGNOSIS — I5020 Unspecified systolic (congestive) heart failure: Secondary | ICD-10-CM | POA: Insufficient documentation

## 2019-07-15 MED ORDER — GADOBUTROL 1 MMOL/ML IV SOLN
10.0000 mL | Freq: Once | INTRAVENOUS | Status: AC | PRN
  Administered 2019-07-15: 12:00:00 10 mL via INTRAVENOUS

## 2019-08-14 ENCOUNTER — Other Ambulatory Visit: Payer: Self-pay

## 2019-08-14 ENCOUNTER — Ambulatory Visit: Payer: Self-pay | Admitting: Cardiology

## 2019-08-21 ENCOUNTER — Other Ambulatory Visit: Payer: Self-pay

## 2019-08-21 ENCOUNTER — Ambulatory Visit (INDEPENDENT_AMBULATORY_CARE_PROVIDER_SITE_OTHER): Payer: Self-pay | Admitting: Cardiology

## 2019-08-21 ENCOUNTER — Encounter: Payer: Self-pay | Admitting: Cardiology

## 2019-08-21 VITALS — BP 110/82 | HR 59 | Ht 66.0 in | Wt 191.0 lb

## 2019-08-21 DIAGNOSIS — I5020 Unspecified systolic (congestive) heart failure: Secondary | ICD-10-CM

## 2019-08-21 DIAGNOSIS — R0989 Other specified symptoms and signs involving the circulatory and respiratory systems: Secondary | ICD-10-CM

## 2019-08-21 DIAGNOSIS — I428 Other cardiomyopathies: Secondary | ICD-10-CM

## 2019-08-21 DIAGNOSIS — E782 Mixed hyperlipidemia: Secondary | ICD-10-CM

## 2019-08-21 HISTORY — DX: Unspecified systolic (congestive) heart failure: I50.20

## 2019-08-21 HISTORY — DX: Mixed hyperlipidemia: E78.2

## 2019-08-21 MED ORDER — FARXIGA 10 MG PO TABS: 10.0000 mg | ORAL_TABLET | Freq: Every day | ORAL | 5 refills | Status: DC

## 2019-08-21 NOTE — Progress Notes (Signed)
Cardiology Office Note:    Date:  08/21/2019   ID:  Raiford Noble, DOB 22-Dec-1960, MRN [id number]  PCP:  Consuello Closs, MD  Cardiologist:  Berniece Salines, DO  Electrophysiologist:  None   Referring MD: Consuello Closs, MD   Follow-up for nonischemic cardiomyopathy.  History of Present Illness:    Krista Jenkins is a 59 y.o. female with a hx of with no reported history presented initially on June 09, 2019 to the [facility] to be evaluated for cough and shortness of breath.  Prior to her presentation the patient reported that she has been treated for pneumonia 2 weeks prior.  While in the hospital she did get a CT of the chest which was negative for pulmonary embolism but showed moderate pulmonary congestion.  She was started on heart failure treatment and an echocardiogram was performed which show depressed ejection fraction of 20 to 25%.  Giving this new depressed ejection fraction and anginal pain the patient was transferred to [facility] for left heart catheterization.  While at [facility] she did undergo left heart catheterization which showed normal coronaries.  Left ventriculogram did show evidence of ejection fraction less than 25% visually.  Pulmonary capillary wedge pressure was 15 mmHg and a mean pulmonary pressure was 18 mmHg, cardiac output 4.9 L with a cardiac index of 2.5. The patient was subsequently discharged on carvedilol 3.125 mg, spironolactone 12.5 mg and atorvastatin 40 mg due to LDL of 135.  I saw the patient on June 23, 2019 at that time she was here to establish cardiac care she did tell me that she had with recent significant fatigue.  At the conclusion her visit I added low-dose Entresto, ordered an MRI to investigate her nonischemic cardiomyopathy, as well as get the patient set up for LifeVest as I was concerned for her low threshold for ventricular arrhythmia given her lightheadedness.  In the interim patient was able to get her MRI  which showed a ejection fraction of 60% with no specific late enhancement gadolinium that suggest any type of specific processes.  She was also set up for cardiac rehab which the patient prefers on but this was denied due to the patient significantly depressed ejection fraction.   The patient is here for follow-up visit today.  She tells me that she has been trying to exercise, including she has changed her diet significantly.  She denies any chest pain.  She does experience some shortness of breath and fatigue at times.  No other complaints at this time. Past Medical History:  Diagnosis Date  . Depressed left ventricular ejection fraction   . Heart failure with reduced ejection fraction ([place])   . Hyperlipidemia   . Nonischemic cardiomyopathy [facility])     Past Surgical History:  Procedure Laterality Date  . CESAREAN SECTION    . RIGHT/LEFT HEART CATH AND CORONARY ANGIOGRAPHY N/A 06/11/2019   Procedure: RIGHT/LEFT HEART CATH AND CORONARY ANGIOGRAPHY;  Surgeon: Jettie Booze, MD;  Location: [facility];  Service: Cardiovascular;  Laterality: N/A;    Current Medications: Current Meds  Medication Sig  . amitriptyline (ELAVIL) 25 MG tablet Take 50 mg by mouth at bedtime.  Marland Kitchen atorvastatin (LIPITOR) 40 MG tablet Take 1 tablet (40 mg total) by mouth daily at 6 PM.  . carvedilol (COREG) 3.125 MG tablet Take 1 tablet (3.125 mg total) by mouth 2 (two) times daily with a meal.  . LORazepam (ATIVAN) 1 MG tablet Take 1 mg (1 tab)  prior to MRI.  . sacubitril-valsartan (ENTRESTO) 24-26 MG Take 1 tablet by mouth 2 (two) times daily.  Marland Kitchen spironolactone (ALDACTONE) 25 MG tablet Take 0.5 tablets (12.5 mg total) by mouth daily.     Allergies:   Latex   Social History   Socioeconomic History  . Marital status: Unknown    Spouse name: Not on file  . Number of children: Not on file  . Years of education: Not on file  . Highest education level: Not on file  Occupational History  . Not on  file  Tobacco Use  . Smoking status: Former Games developer  . Smokeless tobacco: Never Used  Substance and Sexual Activity  . Alcohol use: Not Currently  . Drug use: Not on file  . Sexual activity: Not on file  Other Topics Concern  . Not on file  Social History Narrative  . Not on file   Social Determinants of Health   Financial Resource Strain:   . Difficulty of Paying Living Expenses: Not on file  Food Insecurity:   . Worried About Programme researcher, broadcasting/film/video in the Last Year: Not on file  . Ran Out of Food in the Last Year: Not on file  Transportation Needs:   . Lack of Transportation (Medical): Not on file  . Lack of Transportation (Non-Medical): Not on file  Physical Activity:   . Days of Exercise per Week: Not on file  . Minutes of Exercise per Session: Not on file  Stress:   . Feeling of Stress : Not on file  Social Connections:   . Frequency of Communication with Friends and Family: Not on file  . Frequency of Social Gatherings with Friends and Family: Not on file  . Attends Religious Services: Not on file  . Active Member of Clubs or Organizations: Not on file  . Attends Banker Meetings: Not on file  . Marital Status: Not on file     Family History: The patient's family history includes CAD in her father; Hypertension in her brother.  ROS:   Review of Systems  Constitution: Negative for decreased appetite, fever and weight gain.  HENT: Negative for congestion, ear discharge, hoarse voice and sore throat.   Eyes: Negative for discharge, redness, vision loss in right eye and visual halos.  Cardiovascular: Negative for chest pain, dyspnea on exertion, leg swelling, orthopnea and palpitations.  Respiratory: Negative for cough, hemoptysis, shortness of breath and snoring.   Endocrine: Negative for heat intolerance and polyphagia.  Hematologic/Lymphatic: Negative for bleeding problem. Does not bruise/bleed easily.  Skin: Negative for flushing, nail changes, rash and  suspicious lesions.  Musculoskeletal: Negative for arthritis, joint pain, muscle cramps, myalgias, neck pain and stiffness.  Gastrointestinal: Negative for abdominal pain, bowel incontinence, diarrhea and excessive appetite.  Genitourinary: Negative for decreased libido, genital sores and incomplete emptying.  Neurological: Negative for brief paralysis, focal weakness, headaches and loss of balance.  Psychiatric/Behavioral: Negative for altered mental status, depression and suicidal ideas.  Allergic/Immunologic: Negative for HIV exposure and persistent infections.    EKGs/Labs/Other Studies Reviewed:    The following studies were reviewed today:   EKG:  None today   Cardiac MR IMPRESSION: July 15, 2019 1. Findings consistent with non ischemic DCM. Severe LVE with diffuse hypokinesis EF 16%  2.  Normal RV size and function  3.  Moderate LAE  4.  Mild appearing MR  5. No delayed enhancement in LV myocardium on delayed inversion recovery sequences  6. Parametric parameters not  consistent with edema or infiltrative cardiomyopathy.   Echocardiogram performed at [facility] on 06/10/2019 Left ventricle was severely dilated.  Overall left ventricular ejection fraction severely depressed 2025%.  The right ventricle was normal size.  Left atrium was mildly dilated.  Mild aortic valve sclerosis.  Mild to moderate mitral regurgitation.  Tricuspid valve was normal.  Trace pulmonic regurgitation.  Left heart catheterization  There is severe left ventricular systolic dysfunction.  The left ventricular ejection fraction is less than 25% by visual estimate.  LV end diastolic pressure is mildly elevated. LVEDP 20 mm Hg.  There is no aortic valve stenosis.  Ao sat 94%, PA sat 66%, mean PA pressure 18 mm Hg, mean PCWP 15 mm Hg; CO 4.9 L/min; CI 2.5  No angiographically apparent CAD. Nonischemic cardiomyopathy. Continue aggressive medical therapy.    Recent  Labs: 06/10/2019: ALT 42; B Natriuretic Peptide 425.0; TSH 2.607 06/12/2019: Hemoglobin 14.1; Platelets 208 06/23/2019: BUN 22; Creatinine, Ser 0.91; Magnesium 2.1; Potassium 4.8; Sodium 137  Recent Lipid Panel No results found for: CHOL, TRIG, HDL, CHOLHDL, VLDL, LDLCALC, LDLDIRECT  Physical Exam:    VS:  BP 110/82 (BP Location: Left Arm, Patient Position: Sitting, Cuff Size: Normal)   Pulse (!) 59   Ht 5\' 6"  (1.676 m)   Wt 191 lb (86.6 kg)   LMP  (LMP Unknown)   SpO2 99%   BMI 30.83 kg/m     Wt Readings from Last 3 Encounters:  08/21/19 191 lb (86.6 kg)  06/23/19 187 lb 9.6 oz (85.1 kg)  06/12/19 187 lb 4.8 oz (85 kg)     GEN: Well nourished, well developed in no acute distress HEENT: Normal NECK: No JVD; No carotid bruits LYMPHATICS: No lymphadenopathy CARDIAC: S1S2 noted,RRR, no murmurs, rubs, gallops RESPIRATORY:  Clear to auscultation without rales, wheezing or rhonchi  ABDOMEN: Soft, non-tender, non-distended, +bowel sounds, no guarding. EXTREMITIES: No edema, No cyanosis, no clubbing MUSCULOSKELETAL:  No deformity  SKIN: Warm and dry NEUROLOGIC:  Alert and oriented x 3, non-focal PSYCHIATRIC:  Normal affect, good insight  ASSESSMENT:    1. Non-ischemic cardiomyopathy (HCC)   2. Heart failure with reduced ejection fraction (HCC)   3. Mixed hyperlipidemia   4. ACC/AHA stage C systolic heart failure (HCC)   5. NYHA class 3 heart failure with reduced ejection fraction (HCC)   6. Depressed left ventricular ejection fraction    PLAN:    She is improving daily.  She is currently on Coreg 3.125 twice a day, Entresto 24-26 mg twice daily, Aldactone 12.5 mg daily.  At this time I am going to add Farxiga 10 mg daily to complete her regimen.  Some samples of Marcelline Deist was given to the patient in the office today for a month supply.  She will continue on her LifeVest for now-she denies any discharges.  Repeat echocardiogram is scheduled for October 01, 2019 to reassess her  LVEF.Marland Kitchen  Unfortunately she has not been able to get into cardiac rehab she is agreeable to be recommended to the [facility] cardiac rehab facility.  She is stage C, NYHA class III heart failure.  I also like her to be evaluated for advanced heart failure team.  She tells me that she is exercising at home and has gently started some work on the treadmill.  Blood work will be performed today with BMP, mag.  Hyperlipidemia-continue patient on atorvastatin 40 mg daily.   The patient is in agreement with the above plan. The patient left the office  in stable condition.  The patient will follow up in 3 months or sooner if needed   Medication Adjustments/Labs and Tests Ordered: Current medicines are reviewed at length with the patient today.  Concerns regarding medicines are outlined above.  Orders Placed This Encounter  Procedures  . Basic Metabolic Panel (BMET)  . Magnesium  . AMB referral to CHF clinic   Meds ordered this encounter  Medications  . dapagliflozin propanediol (FARXIGA) 10 MG TABS tablet    Sig: Take 10 mg by mouth daily before breakfast.    Dispense:  30 tablet    Refill:  5    Patient Instructions  Medication Instructions:  Your physician has recommended you make the following change in your medication:    START:Farxiga 10 mg Take 1 tab at breakfast  *If you need a refill on your cardiac medications before your next appointment, please call your pharmacy*   Lab Work: Your physician recommends that you return for lab work in: TODAY BMP,Magnesium  If you have labs (blood work) drawn today and your tests are completely normal, you will receive your results only by: Marland Kitchen MyChart Message (if you have MyChart) OR . A paper copy in the mail If you have any lab test that is abnormal or we need to change your treatment, we will call you to review the results.   Testing/Procedures: None   Follow-Up: At [facility], you and your health needs are our  priority.  As part of our continuing mission to provide you with exceptional heart care, we have created designated Provider Care Teams.  These Care Teams include your primary Cardiologist (physician) and Advanced Practice Providers (APPs -  Physician Assistants and Nurse Practitioners) who all work together to provide you with the care you need, when you need it.  We recommend signing up for the patient portal called "MyChart".  Sign up information is provided on this After Visit Summary.  MyChart is used to connect with patients for Virtual Visits (Telemedicine).  Patients are able to view lab/test results, encounter notes, upcoming appointments, etc.  Non-urgent messages can be sent to your provider as well.   To learn more about what you can do with MyChart, go to [email or web address].    Your next appointment:   1 month(s) after echo  The format for your next appointment:   In Person  Provider:      Other Instructions You are being referred to cardiac rehab at [facility]. They will call you with date and times of appointments.  Ou are being referred to Dr Shirlee Latch for your heart failure. They will contact you with an appointment date and time     Adopting a Healthy Lifestyle.  Know what a healthy weight is for you (roughly BMI <25) and aim to maintain this   Aim for 7+ servings of fruits and vegetables daily   65-80+ fluid ounces of water or unsweet tea for healthy kidneys   Limit to max 1 drink of alcohol per day; avoid smoking/tobacco   Limit animal fats in diet for cholesterol and heart health - choose grass fed whenever available   Avoid highly processed foods, and foods high in saturated/trans fats   Aim for low stress - take time to unwind and care for your mental health   Aim for 150 min of moderate intensity exercise weekly for heart health, and weights twice weekly for bone health   Aim for 7-9 hours of sleep daily   When it comes  to diets, agreement  about the perfect plan isnt easy to find, even among the experts. Experts at the [facility] [employer] developed an idea known as the Healthy Eating Plate. Just imagine a plate divided into logical, healthy portions.   The emphasis is on diet quality:   Load up on vegetables and fruits - one-half of your plate: Aim for color and variety, and remember that potatoes dont count.   Go for whole grains - one-quarter of your plate: Whole wheat, barley, wheat berries, quinoa, oats, brown rice, and foods made with them. If you want pasta, go with whole wheat pasta.   Protein power - one-quarter of your plate: Fish, chicken, beans, and nuts are all healthy, versatile protein sources. Limit red meat.   The diet, however, does go beyond the plate, offering a few other suggestions.   Use healthy plant oils, such as olive, canola, soy, corn, sunflower and peanut. Check the labels, and avoid partially hydrogenated oil, which have unhealthy trans fats.   If youre thirsty, drink water. Coffee and tea are good in moderation, but skip sugary drinks and limit milk and dairy products to one or two daily servings.   The type of carbohydrate in the diet is more important than the amount. Some sources of carbohydrates, such as vegetables, fruits, whole grains, and beans-are healthier than others.   Finally, stay active  Signed, Thomasene Ripple, DO  08/21/2019 3:03 PM    [facility]

## 2019-08-21 NOTE — Patient Instructions (Signed)
Medication Instructions:  Your physician has recommended you make the following change in your medication:    START:Farxiga 10 mg Take 1 tab at breakfast  *If you need a refill on your cardiac medications before your next appointment, please call your pharmacy*   Lab Work: Your physician recommends that you return for lab work in: TODAY BMP,Magnesium  If you have labs (blood work) drawn today and your tests are completely normal, you will receive your results only by: Marland Kitchen MyChart Message (if you have MyChart) OR . A paper copy in the mail If you have any lab test that is abnormal or we need to change your treatment, we will call you to review the results.   Testing/Procedures: None   Follow-Up: At [facility], you and your health needs are our priority.  As part of our continuing mission to provide you with exceptional heart care, we have created designated Provider Care Teams.  These Care Teams include your primary Cardiologist (physician) and Advanced Practice Providers (APPs -  Physician Assistants and Nurse Practitioners) who all work together to provide you with the care you need, when you need it.  We recommend signing up for the patient portal called "MyChart".  Sign up information is provided on this After Visit Summary.  MyChart is used to connect with patients for Virtual Visits (Telemedicine).  Patients are able to view lab/test results, encounter notes, upcoming appointments, etc.  Non-urgent messages can be sent to your provider as well.   To learn more about what you can do with MyChart, go to [email or web address].    Your next appointment:   1 month(s) after echo  The format for your next appointment:   In Person  Provider:      Other Instructions You are being referred to cardiac rehab at [facility]. They will call you with date and times of appointments.  Ou are being referred to Dr Shirlee Latch for your heart failure. They will contact you with an  appointment date and time

## 2019-08-22 LAB — BASIC METABOLIC PANEL
BUN/Creatinine Ratio: 22 (ref 9–23)
BUN: 16 mg/dL (ref 6–24)
CO2: 24 mmol/L (ref 20–29)
Calcium: 9.6 mg/dL (ref 8.7–10.2)
Chloride: 103 mmol/L (ref 96–106)
Creatinine, Ser: 0.74 mg/dL (ref 0.57–1.00)
GFR calc Af Amer: 103 mL/min/{1.73_m2} (ref >59)
GFR calc non Af Amer: 90 mL/min/{1.73_m2} (ref >59)
Glucose: 106 mg/dL — ABNORMAL HIGH (ref 65–99)
Potassium: 4.3 mmol/L (ref 3.5–5.2)
Sodium: 142 mmol/L (ref 134–144)

## 2019-08-22 LAB — MAGNESIUM: Magnesium: 2.1 mg/dL (ref 1.6–2.3)

## 2019-08-24 ENCOUNTER — Telehealth: Payer: Self-pay

## 2019-08-24 NOTE — Telephone Encounter (Signed)
Unfortunately, I can not prescribe anything for her anxiety can she please speak with her primary care doctor about this.

## 2019-08-24 NOTE — Telephone Encounter (Signed)
Follow up  ° ° ° ° °Pt is returning your call  °

## 2019-08-24 NOTE — Telephone Encounter (Signed)
Called patient back, no answer, [facility]

## 2019-08-24 NOTE — Telephone Encounter (Signed)
Patient is aware and states that she's already spoken with her PCP.

## 2019-09-16 ENCOUNTER — Ambulatory Visit: Payer: Medicaid Other | Admitting: Cardiology

## 2019-10-01 ENCOUNTER — Ambulatory Visit (INDEPENDENT_AMBULATORY_CARE_PROVIDER_SITE_OTHER): Payer: Self-pay

## 2019-10-01 ENCOUNTER — Other Ambulatory Visit: Payer: Self-pay

## 2019-10-01 DIAGNOSIS — I5020 Unspecified systolic (congestive) heart failure: Secondary | ICD-10-CM

## 2019-10-01 NOTE — Progress Notes (Deleted)
Complete echocardiogram has been performed.  Jimmy Jeorge Reister RDCS, RVT 

## 2019-10-01 NOTE — Progress Notes (Signed)
Complete echocardiogram performed.  Jimmy Mercedies Ganesh RDCS, RVT  

## 2019-10-05 ENCOUNTER — Other Ambulatory Visit: Payer: Self-pay

## 2019-10-05 ENCOUNTER — Ambulatory Visit (INDEPENDENT_AMBULATORY_CARE_PROVIDER_SITE_OTHER): Payer: Self-pay | Admitting: Cardiology

## 2019-10-05 ENCOUNTER — Encounter: Payer: Self-pay | Admitting: Cardiology

## 2019-10-05 VITALS — BP 100/80 | HR 105 | Ht 66.0 in | Wt 186.0 lb

## 2019-10-05 DIAGNOSIS — I5020 Unspecified systolic (congestive) heart failure: Secondary | ICD-10-CM

## 2019-10-05 DIAGNOSIS — R0989 Other specified symptoms and signs involving the circulatory and respiratory systems: Secondary | ICD-10-CM

## 2019-10-05 DIAGNOSIS — I428 Other cardiomyopathies: Secondary | ICD-10-CM

## 2019-10-05 DIAGNOSIS — E782 Mixed hyperlipidemia: Secondary | ICD-10-CM

## 2019-10-05 NOTE — Patient Instructions (Signed)
Medication Instructions:  Your physician recommends that you continue on your current medications as directed. Please refer to the Current Medication list given to you today.  *If you need a refill on your cardiac medications before your next appointment, please call your pharmacy*   Lab Work: NONE If you have labs (blood work) drawn today and your tests are completely normal, you will receive your results only by: Marland Kitchen MyChart Message (if you have MyChart) OR . A paper copy in the mail If you have any lab test that is abnormal or we need to change your treatment, we will call you to review the results.   Testing/Procedures: NONE   Follow-Up: At [facility], you and your health needs are our priority.  As part of our continuing mission to provide you with exceptional heart care, we have created designated Provider Care Teams.  These Care Teams include your primary Cardiologist (physician) and Advanced Practice Providers (APPs -  Physician Assistants and Nurse Practitioners) who all work together to provide you with the care you need, when you need it.  We recommend signing up for the patient portal called "MyChart".  Sign up information is provided on this After Visit Summary.  MyChart is used to connect with patients for Virtual Visits (Telemedicine).  Patients are able to view lab/test results, encounter notes, upcoming appointments, etc.  Non-urgent messages can be sent to your provider as well.   To learn more about what you can do with MyChart, go to [email or web address].    Your next appointment:   3 month(s)  The format for your next appointment:   In Person  Provider:   Thomasene Ripple, DO   Other Instructions None

## 2019-10-05 NOTE — Progress Notes (Signed)
Cardiology Office Note:    Date:  10/05/2019   ID:  Krista Jenkins, DOB 1961-02-19, MRN [id number]  PCP:  [name], MD  Cardiologist:  Thomasene Ripple, DO  Electrophysiologist:  None   Referring MD: [name], MD   Chief Complaint  Patient presents with  . Follow-up    History of Present Illness:    Krista Jenkins a 59 y.o.femalewith a hx of with no reported history presented initially on June 09, 2019 to the [facility] to be evaluated for cough and shortness of breath. Prior to her presentation the patient reported that she has been treated for pneumonia 2 weeks prior.While in the hospital she did get a CT of the chest which was negative for pulmonary embolism but showed moderate pulmonary congestion. She was started on heart failure treatment and an echocardiogram was performed which show depressed ejection fraction of 20 to 25%. Giving this new depressed ejection fraction and anginal pain the patient was transferred to [facility] for left heart catheterization.  While at [facility] she did undergo left heart catheterization which showed normal coronaries. Left ventriculogram did show evidence of ejection fraction less than 25% visually. Pulmonary capillary wedge pressure was 15 mmHg and a mean pulmonary pressure was 18 mmHg,cardiac output 4.9 L with a cardiac index of 2.5. The patient was subsequently discharged on carvedilol 3.125 mg, spironolactone 12.5 mg and atorvastatin 40 mg due to LDL of 135.  I saw the patient on June 23, 2019 at that time she was here to establish cardiac care she did tell me that she had with recent significant fatigue.  At the conclusion her visit I added low-dose Entresto, ordered an MRI to investigate her nonischemic cardiomyopathy, as well as get the patient set up for LifeVest as I was concerned for her low threshold for ventricular arrhythmia given her lightheadedness.  Today for her cardiac MRI on  July 23, 2019 which showed a ejection fraction of 60% with no specific late enhancement gadolinium that suggest any type of specific processes.  I last saw her on August 21, 2019 at that time we added Farxiga 10 mg to her medication regimen.  Tells me that she has been doing well on this medication she has not had any side effects.  Last saw her she had a repeat echocardiogram, and she has started cardiac rehab at [facility].  She has no complaints at this time.  She denies any chest pain, shortness of breath.  She feels well she tells me.  She has started a Mediterranean diet and has been following with patient.  Still wearing a LifeVest has not had any firing of this vest.    Past Medical History:  Diagnosis Date  . ACC/AHA stage C systolic heart failure (HCC) 08/21/2019  . Acute systolic CHF (congestive heart failure) (HCC) 06/10/2019  . Asthma 05/16/2018  . Cervical radiculopathy 06/27/2018  . Chronic back pain 05/16/2018  . Degeneration of lumbar intervertebral disc 06/27/2018  . Depressed left ventricular ejection fraction   . Heart failure with reduced ejection fraction (HCC)   . Hyperlipidemia   . Lumbar radiculopathy 06/28/2018  . Mixed hyperlipidemia 08/21/2019  . Neck pain 05/16/2018  . Nonischemic cardiomyopathy (HCC)   . 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 Scoliosis deformity of spine 07/17/2018    Past Surgical History:  Procedure Laterality Date  . CESAREAN SECTION    . RIGHT/LEFT HEART CATH AND CORONARY ANGIOGRAPHY N/A 06/11/2019  Procedure: RIGHT/LEFT HEART CATH AND CORONARY ANGIOGRAPHY;  Surgeon: Corky Crafts, MD;  Location: [facility] INVASIVE CV LAB;  Service: Cardiovascular;  Laterality: N/A;    Current Medications: Current Meds  Medication Sig  . amitriptyline (ELAVIL) 25 MG tablet Take 50 mg by mouth at bedtime.  Marland Kitchen atorvastatin (LIPITOR) 40 MG tablet Take 1 tablet (40 mg total) by mouth daily at 6 PM.  . carvedilol  (COREG) 3.125 MG tablet Take 1 tablet (3.125 mg total) by mouth 2 (two) times daily with a meal.  . citalopram (CELEXA) 10 MG tablet Take 10 mg by mouth daily.  . dapagliflozin propanediol (FARXIGA) 10 MG TABS tablet Take 10 mg by mouth daily before breakfast.  . LORazepam (ATIVAN) 1 MG tablet Take 1 mg (1 tab) prior to MRI.  . sacubitril-valsartan (ENTRESTO) 24-26 MG Take 1 tablet by mouth 2 (two) times daily.  Marland Kitchen spironolactone (ALDACTONE) 25 MG tablet Take 0.5 tablets (12.5 mg total) by mouth daily.     Allergies:   Latex   Social History   Socioeconomic History  . Marital status: Unknown    Spouse name: Not on file  . Number of children: Not on file  . Years of education: Not on file  . Highest education level: Not on file  Occupational History  . Not on file  Tobacco Use  . Smoking status: Former Games developer  . Smokeless tobacco: Never Used  Substance and Sexual Activity  . Alcohol use: Not Currently  . Drug use: Not on file  . Sexual activity: Not on file  Other Topics Concern  . Not on file  Social History Narrative  . Not on file   Social Determinants of Health   Financial Resource Strain:   . Difficulty of Paying Living Expenses:   Food Insecurity:   . Worried About Programme researcher, broadcasting/film/video in the Last Year:   . Barista in the Last Year:   Transportation Needs:   . Freight forwarder (Medical):   Marland Kitchen Lack of Transportation (Non-Medical):   Physical Activity:   . Days of Exercise per Week:   . Minutes of Exercise per Session:   Stress:   . Feeling of Stress :   Social Connections:   . Frequency of Communication with Friends and Family:   . Frequency of Social Gatherings with Friends and Family:   . Attends Religious Services:   . Active Member of Clubs or Organizations:   . Attends Banker Meetings:   Marland Kitchen Marital Status:      Family History: The patient's family history includes CAD in her father; Hypertension in her brother.  ROS:     Review of Systems  Constitution: Negative for decreased appetite, fever and weight gain.  HENT: Negative for congestion, ear discharge, hoarse voice and sore throat.   Eyes: Negative for discharge, redness, vision loss in right eye and visual halos.  Cardiovascular: Negative for chest pain, dyspnea on exertion, leg swelling, orthopnea and palpitations.  Respiratory: Negative for cough, hemoptysis, shortness of breath and snoring.   Endocrine: Negative for heat intolerance and polyphagia.  Hematologic/Lymphatic: Negative for bleeding problem. Does not bruise/bleed easily.  Skin: Negative for flushing, nail changes, rash and suspicious lesions.  Musculoskeletal: Negative for arthritis, joint pain, muscle cramps, myalgias, neck pain and stiffness.  Gastrointestinal: Negative for abdominal pain, bowel incontinence, diarrhea and excessive appetite.  Genitourinary: Negative for decreased libido, genital sores and incomplete emptying.  Neurological: Negative for brief paralysis, focal weakness,  headaches and loss of balance.  Psychiatric/Behavioral: Negative for altered mental status, depression and suicidal ideas.  Allergic/Immunologic: Negative for HIV exposure and persistent infections.    EKGs/Labs/Other Studies Reviewed:    The following studies were reviewed today:   EKG:  The ekg ordered today demonstrates   TTE 10/01/19 IMPRESSIONS  1. Left ventricular ejection fraction, by estimation, is <20%. The left ventricle has severely decreased function. The left ventricle has noregional wall motion abnormalities. The left ventricular internal cavity  size was severely dilated. Left ventricular diastolic parameters are consistent with Grade III diastolic  dysfunction (restrictive).  2. Right ventricular systolic function is normal. The right ventricular size is normal. There is normal pulmonary artery systolic pressure.  3. Left atrial size was mildly dilated.  4. The mitral valve is normal  in structure. Mild mitral valve regurgitation. No evidence of mitral stenosis.  5. The aortic valve is normal in structure. Aortic valve regurgitation is not visualized. No aortic stenosis is present.  6. The inferior vena cava is normal in size with greater than 50% respiratory variability, suggesting right atrial pressure of  3 mmhG.  Cardiac MRI IMPRESSION:07/15/19 1. Findings consistent with non ischemic DCM. Severe LVE with diffuse hypokinesis EF 16%  2.  Normal RV size and function  3.  Moderate LAE  4.  Mild appearing MR  5. No delayed enhancement in LV myocardium on delayed inversion recovery sequences  6. Parametric parameters not consistent with edema or infiltrative cardiomyopathy.  LHC 06/10/2020  There is severe left ventricular systolic dysfunction.  The left ventricular ejection fraction is less than 25% by visual estimate.  LV end diastolic pressure is mildly elevated. LVEDP 20 mm Hg.  There is no aortic valve stenosis.  Ao sat 94%, PA sat 66%, mean PA pressure 18 mm Hg, mean PCWP 15 mm Hg; CO 4.9 L/min; CI 2.5  No angiographically apparent CAD. Nonischemic cardiomyopathy.  Continue aggressive medical therapy.   Recent Labs: 06/10/2019: ALT 42; B Natriuretic Peptide 425.0; TSH 2.607 06/12/2019: Hemoglobin 14.1; Platelets 208 08/21/2019: BUN 16; Creatinine, Ser 0.74; Magnesium 2.1; Potassium 4.3; Sodium 142  Recent Lipid Panel No results found for: CHOL, TRIG, HDL, CHOLHDL, VLDL, LDLCALC, LDLDIRECT  Physical Exam:    VS:  BP 100/80 (BP Location: Right Arm, Patient Position: Sitting, Cuff Size: Large)   Pulse (!) 105   Ht 5\' 6"  (1.676 m)   Wt 186 lb (84.4 kg)   LMP  (LMP Unknown)   SpO2 98%   BMI 30.02 kg/m     Wt Readings from Last 3 Encounters:  10/05/19 186 lb (84.4 kg)  08/21/19 191 lb (86.6 kg)  06/23/19 187 lb 9.6 oz (85.1 kg)     GEN: Well nourished, well developed in no acute distress HEENT: Normal NECK: No JVD; No carotid  bruits LYMPHATICS: No lymphadenopathy CARDIAC: S1S2 noted,RRR, no murmurs, rubs, gallops RESPIRATORY:  Clear to auscultation without rales, wheezing or rhonchi  ABDOMEN: Soft, non-tender, non-distended, +bowel sounds, no guarding. EXTREMITIES: No edema, No cyanosis, no clubbing MUSCULOSKELETAL:  No deformity  SKIN: Warm and dry NEUROLOGIC:  Alert and oriented x 3, non-focal PSYCHIATRIC:  Normal affect, good insight  ASSESSMENT:    1. Nonischemic cardiomyopathy (HCC)   2. Heart failure with reduced ejection fraction (HCC)   3. Depressed left ventricular ejection fraction   4. Mixed hyperlipidemia   5. NYHA class 3 heart failure with reduced ejection fraction (HCC)    PLAN:     1.  She has  been doing well clinically.  Unfortunately her repeat echocardiogram done April 18 showed that her EF still below 20%.  I did discuss the findings with the patient. We hope for improvement but is still optimistic.  I will continue the patient on carvedilol 3.125 mg twice a day, Entresto 24-26 mg twice daily, Aldactone 12.5 mg daily and her Farxiga 10 mg daily.  Although she is on guideline directed medical therapy but her blood pressure will not tolerate dose titration.   From my since she has not had any firings with this device.  I discussed with the patient that we may need consider the possibility of discontinuing the  Lifevest. Shared decision to wait until she sees heart failure and get their input as well. She is looking forward to her appointment with our heart failure clinic evaluation.   2.  Hyperlipidemia continue patient on her current dose of atorvastatin.  3.  Obesity-she is actively dieting and referred to more weight loss.  He has lost 5 pounds since her last visit.  The patient is in agreement with the above plan. The patient left the office in stable condition.  The patient will follow up in 3 months.   Medication Adjustments/Labs and Tests Ordered: Current medicines are reviewed  at length with the patient today.  Concerns regarding medicines are outlined above.  No orders of the defined types were placed in this encounter.  No orders of the defined types were placed in this encounter.   Patient Instructions  Medication Instructions:  Your physician recommends that you continue on your current medications as directed. Please refer to the Current Medication list given to you today.  *If you need a refill on your cardiac medications before your next appointment, please call your pharmacy*   Lab Work: NONE If you have labs (blood work) drawn today and your tests are completely normal, you will receive your results only by: Marland Kitchen MyChart Message (if you have MyChart) OR . A paper copy in the mail If you have any lab test that is abnormal or we need to change your treatment, we will call you to review the results.   Testing/Procedures: NONE   Follow-Up: At [facility], you and your health needs are our priority.  As part of our continuing mission to provide you with exceptional heart care, we have created designated Provider Care Teams.  These Care Teams include your primary Cardiologist (physician) and Advanced Practice Providers (APPs -  Physician Assistants and Nurse Practitioners) who all work together to provide you with the care you need, when you need it.  We recommend signing up for the patient portal called "MyChart".  Sign up information is provided on this After Visit Summary.  MyChart is used to connect with patients for Virtual Visits (Telemedicine).  Patients are able to view lab/test results, encounter notes, upcoming appointments, etc.  Non-urgent messages can be sent to your provider as well.   To learn more about what you can do with MyChart, go to [email or web address].    Your next appointment:   3 month(s)  The format for your next appointment:   In Person  Provider:   Thomasene Ripple, DO   Other Instructions None      Adopting a  Healthy Lifestyle.  Know what a healthy weight is for you (roughly BMI <25) and aim to maintain this   Aim for 7+ servings of fruits and vegetables daily   65-80+ fluid ounces of water or unsweet tea for healthy  kidneys   Limit to max 1 drink of alcohol per day; avoid smoking/tobacco   Limit animal fats in diet for cholesterol and heart health - choose grass fed whenever available   Avoid highly processed foods, and foods high in saturated/trans fats   Aim for low stress - take time to unwind and care for your mental health   Aim for 150 min of moderate intensity exercise weekly for heart health, and weights twice weekly for bone health   Aim for 7-9 hours of sleep daily   When it comes to diets, agreement about the perfect plan isnt easy to find, even among the experts. Experts at the [place] developed an idea known as the Healthy Eating Plate. Just imagine a plate divided into logical, healthy portions.   The emphasis is on diet quality:   Load up on vegetables and fruits - one-half of your plate: Aim for color and variety, and remember that potatoes dont count.   Go for whole grains - one-quarter of your plate: Whole wheat, barley, wheat berries, quinoa, oats, brown rice, and foods made with them. If you want pasta, go with whole wheat pasta.   Protein power - one-quarter of your plate: Fish, chicken, beans, and nuts are all healthy, versatile protein sources. Limit red meat.   The diet, however, does go beyond the plate, offering a few other suggestions.   Use healthy plant oils, such as olive, canola, soy, corn, sunflower and peanut. Check the labels, and avoid partially hydrogenated oil, which have unhealthy trans fats.   If youre thirsty, drink water. Coffee and tea are good in moderation, but skip sugary drinks and limit milk and dairy products to one or two daily servings.   The type of carbohydrate in the diet is more important than the  amount. Some sources of carbohydrates, such as vegetables, fruits, whole grains, and beans-are healthier than others.   Finally, stay active  Signed, Berniece Salines, DO  10/05/2019 3:54 PM    [facility]

## 2019-10-13 ENCOUNTER — Encounter: Payer: Self-pay | Admitting: Cardiology

## 2019-10-13 ENCOUNTER — Telehealth: Payer: Self-pay

## 2019-10-13 ENCOUNTER — Ambulatory Visit (INDEPENDENT_AMBULATORY_CARE_PROVIDER_SITE_OTHER): Payer: Self-pay | Admitting: Cardiology

## 2019-10-13 ENCOUNTER — Other Ambulatory Visit: Payer: Self-pay

## 2019-10-13 VITALS — BP 100/64 | HR 68 | Temp 97.4°F | Ht 66.0 in | Wt 186.0 lb

## 2019-10-13 DIAGNOSIS — I5020 Unspecified systolic (congestive) heart failure: Secondary | ICD-10-CM

## 2019-10-13 DIAGNOSIS — E782 Mixed hyperlipidemia: Secondary | ICD-10-CM

## 2019-10-13 DIAGNOSIS — I5021 Acute systolic (congestive) heart failure: Secondary | ICD-10-CM

## 2019-10-13 NOTE — Telephone Encounter (Signed)
Pt called back and is not at home where her hot spot is located. I will try calling [facility] back tomorrow for information.

## 2019-10-13 NOTE — Telephone Encounter (Signed)
Patient stated she was at the beauty shop getting her hair done around 12:30 pm when she felt funny and warm, like she was going to pass out. Patient stated she was just sitting at the time. Patient denies any chest pain or SOB. Patient stated she does wear a life vest and she does not think it went off.  BP 125/89 and HR 100 at the time. Patient stated she feels fine now. Patient did complain about left arm feeling achy. She has also been going to cardiac rehab, which has left her with some leg pain between her ankles and knees. Patient has an appointment today with Dr. Tomie China. Patient was encouraged to call 911 if these symptoms occur again. Patient will keep appointment for today and agreed to plan.

## 2019-10-13 NOTE — Patient Instructions (Signed)
Medication Instructions:  Your physician has recommended you make the following change in your medication:   Stop Spirolactone.  *If you need a refill on your cardiac medications before your next appointment, please call your pharmacy*   Lab Work: Your physician recommends that you have a BMET and Mg level done today in the office.  If you have labs (blood work) drawn today and your tests are completely normal, you will receive your results only by: Marland Kitchen MyChart Message (if you have MyChart) OR . A paper copy in the mail If you have any lab test that is abnormal or we need to change your treatment, we will call you to review the results.   Testing/Procedures: None ordered   Follow-Up: At [facility], you and your health needs are our priority.  As part of our continuing mission to provide you with exceptional heart care, we have created designated Provider Care Teams.  These Care Teams include your primary Cardiologist (physician) and Advanced Practice Providers (APPs -  Physician Assistants and Nurse Practitioners) who all work together to provide you with the care you need, when you need it.  We recommend signing up for the patient portal called "MyChart".  Sign up information is provided on this After Visit Summary.  MyChart is used to connect with patients for Virtual Visits (Telemedicine).  Patients are able to view lab/test results, encounter notes, upcoming appointments, etc.  Non-urgent messages can be sent to your provider as well.   To learn more about what you can do with MyChart, go to [email or web address].    Follow up with Dr. [name] as scheduled.   Other Instructions None ordered

## 2019-10-13 NOTE — Progress Notes (Signed)
Cardiology Office Note:    Date:  10/13/2019   ID:  [name], DOB 1960-11-28, MRN [id number]  PCP:  [name], MD  Cardiologist:  [name], MD   Referring MD: [name], MD    ASSESSMENT:    1. Acute systolic CHF (congestive heart failure) (HCC)   2. Mixed hyperlipidemia   3. 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 Advanced cardiomyopathy: Episode of lightheadedness: I discussed my findings with the patient at extensive length.  She is stable at the time of my evaluation.  Her vital signs are stable.  It is possible that this is an episode of low blood pressure because of her medications.  In view of this she takes spironolactone 12.5 mg daily and I will stop this medication.  I will do a Chem-7 and magnesium level today.  Also she is on LifeVest and we will try to see if we can obtain the recordings and tracings of her rhythm at that time but the symptoms happen this morning.  She is agreeable for this.  She knows to go to the nearest emergency room for any concerns.  She has not had a shock by her LifeVest anytime in the past.  She has an appointment coming up with her congestive heart failure clinic. 2. Congestive heart failure: Diet was emphasized.  Salt intake issues were discussed and daily weight measures were emphasized and she is agreeable to do this.  She is a very compliant patient. 3. Mixed dyslipidemia: Diet was emphasized and this is followed by primary care physician. 4. She will be seen in follow-up appointment soon by her congestive heart failure clinic and follow-up with Dr. [name] as previously planned.   Medication Adjustments/Labs and Tests Ordered: Current medicines are reviewed at length with the patient today.  Concerns regarding medicines are outlined above.  No orders of the defined types were placed in this encounter.  No orders of the defined types were placed in this  encounter.    No chief complaint on file.    History of Present Illness:    Krista Jenkins is a 59 y.o. female.  Patient has past medical history of advanced cardiomyopathy.  She has undergone complete evaluation for this with coronary angiography and with cardiac MRI and both of them were unremarkable.  At this point she is on optimal therapy with guideline directed medical therapy.  She wanted to be seen today.  She was getting her hair done and sitting in a place for some time and then felt lightheaded.  This lasted for about a minute or less.  No chest pain orthopnea PND.  She did not feel any palpitations.  At the time of my evaluation, the patient is alert awake oriented and in no distress.  She mentions to me that her blood pressure can go down to the 90s at times.  She denies any history of passing out spells.  Past Medical History:  Diagnosis Date  . ACC/AHA stage C systolic heart failure (HCC) 08/21/2019  . Acute systolic CHF (congestive heart failure) (HCC) 06/10/2019  . Asthma 05/16/2018  . Cervical radiculopathy 06/27/2018  . Chronic back pain 05/16/2018  . Degeneration of lumbar intervertebral disc 06/27/2018  . Depressed left ventricular ejection fraction   . Heart failure with reduced ejection fraction (HCC)   . Hyperlipidemia   . Lumbar radiculopathy 06/28/2018  . Mixed hyperlipidemia  08/21/2019  . Neck pain 05/16/2018  . Nonischemic cardiomyopathy ([place])   . NYHA class 3 heart failure with reduced ejection fraction ([place]) 08/21/2019  . Scoliosis deformity of spine 07/17/2018    Past Surgical History:  Procedure Laterality Date  . CESAREAN SECTION    . RIGHT/LEFT HEART CATH AND CORONARY ANGIOGRAPHY N/A 06/11/2019   Procedure: RIGHT/LEFT HEART CATH AND CORONARY ANGIOGRAPHY;  Surgeon: Jettie Booze, MD;  Location: [facility];  Service: Cardiovascular;  Laterality: N/A;    Current Medications: Current Meds  Medication Sig  . amitriptyline (ELAVIL) 25 MG tablet  Take 50 mg by mouth at bedtime.  Marland Kitchen atorvastatin (LIPITOR) 40 MG tablet Take 1 tablet (40 mg total) by mouth daily at 6 PM.  . carvedilol (COREG) 3.125 MG tablet Take 1 tablet (3.125 mg total) by mouth 2 (two) times daily with a meal.  . citalopram (CELEXA) 10 MG tablet Take 10 mg by mouth daily.  . dapagliflozin propanediol (FARXIGA) 10 MG TABS tablet Take 10 mg by mouth daily before breakfast.  . sacubitril-valsartan (ENTRESTO) 24-26 MG Take 1 tablet by mouth 2 (two) times daily.  Marland Kitchen spironolactone (ALDACTONE) 25 MG tablet Take 0.5 tablets (12.5 mg total) by mouth daily.     Allergies:   Latex   Social History   Socioeconomic History  . Marital status: Unknown    Spouse name: Not on file  . Number of children: Not on file  . Years of education: Not on file  . Highest education level: Not on file  Occupational History  . Not on file  Tobacco Use  . Smoking status: Former Research scientist (life sciences)  . Smokeless tobacco: Never Used  Substance and Sexual Activity  . Alcohol use: Not Currently  . Drug use: Not on file  . Sexual activity: Not on file  Other Topics Concern  . Not on file  Social History Narrative  . Not on file   Social Determinants of Health   Financial Resource Strain:   . Difficulty of Paying Living Expenses:   Food Insecurity:   . Worried About Charity fundraiser in the Last Year:   . Arboriculturist in the Last Year:   Transportation Needs:   . Film/video editor (Medical):   Marland Kitchen Lack of Transportation (Non-Medical):   Physical Activity:   . Days of Exercise per Week:   . Minutes of Exercise per Session:   Stress:   . Feeling of Stress :   Social Connections:   . Frequency of Communication with Friends and Family:   . Frequency of Social Gatherings with Friends and Family:   . Attends Religious Services:   . Active Member of Clubs or Organizations:   . Attends Archivist Meetings:   Marland Kitchen Marital Status:      Family History: The patient's family history  includes CAD in her father; Hypertension in her brother.  ROS:   Please see the history of present illness.    All other systems reviewed and are negative.  EKGs/Labs/Other Studies Reviewed:    The following studies were reviewed today: I reviewed previous records including echocardiogram and cardiac MRI reports.   Recent Labs: 06/10/2019: ALT 42; B Natriuretic Peptide 425.0; TSH 2.607 06/12/2019: Hemoglobin 14.1; Platelets 208 08/21/2019: BUN 16; Creatinine, Ser 0.74; Magnesium 2.1; Potassium 4.3; Sodium 142  Recent Lipid Panel No results found for: CHOL, TRIG, HDL, CHOLHDL, VLDL, LDLCALC, LDLDIRECT  Physical Exam:    VS:  BP 100/64  Pulse 68   Temp (!) 97.4 F (36.3 C)   Ht 5\' 6"  (1.676 m)   Wt 186 lb (84.4 kg)   LMP  (LMP Unknown)   SpO2 96%   BMI 30.02 kg/m     Wt Readings from Last 3 Encounters:  10/13/19 186 lb (84.4 kg)  10/05/19 186 lb (84.4 kg)  08/21/19 191 lb (86.6 kg)     GEN: Patient is in no acute distress HEENT: Normal NECK: No JVD; No carotid bruits LYMPHATICS: No lymphadenopathy CARDIAC: Hear sounds regular, 2/6 systolic murmur at the apex. RESPIRATORY:  Clear to auscultation without rales, wheezing or rhonchi  ABDOMEN: Soft, non-tender, non-distended MUSCULOSKELETAL:  No edema; No deformity  SKIN: Warm and dry NEUROLOGIC:  Alert and oriented x 3 PSYCHIATRIC:  Normal affect   Signed, 08/23/19, MD  10/13/2019 4:09 PM    [facility]

## 2019-10-13 NOTE — Telephone Encounter (Signed)
Called [employer] Life Vest and requested information on pt's events today around 11:30 when pt had a near syncopal episode, lightheaded and warm feeling. [employer] rep states that the pt does not have her hot spot where if can download. Left message for pt regarding same. Will attempt to call later.

## 2019-10-14 LAB — BASIC METABOLIC PANEL
BUN/Creatinine Ratio: 19 (ref 9–23)
BUN: 16 mg/dL (ref 6–24)
CO2: 26 mmol/L (ref 20–29)
Calcium: 9.7 mg/dL (ref 8.7–10.2)
Chloride: 104 mmol/L (ref 96–106)
Creatinine, Ser: 0.86 mg/dL (ref 0.57–1.00)
GFR calc Af Amer: 86 mL/min/{1.73_m2} (ref >59)
GFR calc non Af Amer: 75 mL/min/{1.73_m2} (ref >59)
Glucose: 109 mg/dL — ABNORMAL HIGH (ref 65–99)
Potassium: 4.1 mmol/L (ref 3.5–5.2)
Sodium: 143 mmol/L (ref 134–144)

## 2019-10-14 LAB — MAGNESIUM: Magnesium: 2.3 mg/dL (ref 1.6–2.3)

## 2019-10-16 NOTE — Telephone Encounter (Signed)
Pt was called back by Dr. [name].

## 2019-10-19 ENCOUNTER — Telehealth: Payer: Self-pay | Admitting: Cardiology

## 2019-10-19 NOTE — Telephone Encounter (Signed)
Spoke with the [employer] rep today and they have no downloads from 10/13/19. States that the last result download was 09/22/19.

## 2019-10-19 NOTE — Telephone Encounter (Signed)
Call to patient who reports she is taking a 5 day prednisone dose pak. Labs on 10/14/2019 were within normal limits.  Patient will check her Bp at home and call if she notes any changes while taking prednisone and Ibuprofen for knee injury.

## 2019-10-19 NOTE — Telephone Encounter (Signed)
Patient states she was prescribed Prednisone and Ibuprofen medications for knee injury and she is calling to ensure she is eligible to take medications with her heart condition. Please advise.

## 2019-10-20 ENCOUNTER — Ambulatory Visit (HOSPITAL_COMMUNITY)
Admission: RE | Admit: 2019-10-20 | Discharge: 2019-10-20 | Disposition: A | Discharge status: Home / Self Care | Payer: Self-pay | Source: Ambulatory Visit | Attending: Internal Medicine | Admitting: Internal Medicine

## 2019-10-20 ENCOUNTER — Encounter (HOSPITAL_COMMUNITY): Payer: Self-pay | Admitting: Internal Medicine

## 2019-10-20 ENCOUNTER — Other Ambulatory Visit: Payer: Self-pay

## 2019-10-20 VITALS — BP 116/76 | HR 96 | Wt 186.8 lb

## 2019-10-20 DIAGNOSIS — Z91040 Latex allergy status: Secondary | ICD-10-CM | POA: Insufficient documentation

## 2019-10-20 DIAGNOSIS — Z7984 Long term (current) use of oral hypoglycemic drugs: Secondary | ICD-10-CM | POA: Insufficient documentation

## 2019-10-20 DIAGNOSIS — M549 Dorsalgia, unspecified: Secondary | ICD-10-CM | POA: Insufficient documentation

## 2019-10-20 DIAGNOSIS — Z79899 Other long term (current) drug therapy: Secondary | ICD-10-CM | POA: Insufficient documentation

## 2019-10-20 DIAGNOSIS — R42 Dizziness and giddiness: Secondary | ICD-10-CM

## 2019-10-20 DIAGNOSIS — I428 Other cardiomyopathies: Secondary | ICD-10-CM | POA: Insufficient documentation

## 2019-10-20 DIAGNOSIS — E782 Mixed hyperlipidemia: Secondary | ICD-10-CM | POA: Insufficient documentation

## 2019-10-20 DIAGNOSIS — Z87891 Personal history of nicotine dependence: Secondary | ICD-10-CM | POA: Insufficient documentation

## 2019-10-20 DIAGNOSIS — G8929 Other chronic pain: Secondary | ICD-10-CM | POA: Insufficient documentation

## 2019-10-20 DIAGNOSIS — R55 Syncope and collapse: Secondary | ICD-10-CM | POA: Insufficient documentation

## 2019-10-20 DIAGNOSIS — Z8249 Family history of ischemic heart disease and other diseases of the circulatory system: Secondary | ICD-10-CM | POA: Insufficient documentation

## 2019-10-20 DIAGNOSIS — I5022 Chronic systolic (congestive) heart failure: Secondary | ICD-10-CM

## 2019-10-20 LAB — BASIC METABOLIC PANEL
Anion gap: 8 (ref 5–15)
BUN: 19 mg/dL (ref 6–20)
CO2: 26 mmol/L (ref 22–32)
Calcium: 9.2 mg/dL (ref 8.9–10.3)
Chloride: 106 mmol/L (ref 98–111)
Creatinine, Ser: 0.71 mg/dL (ref 0.44–1.00)
GFR calc Af Amer: >60 mL/min (ref >60)
GFR calc non Af Amer: >60 mL/min (ref >60)
Glucose, Bld: 128 mg/dL — ABNORMAL HIGH (ref 70–99)
Potassium: 4 mmol/L (ref 3.5–5.1)
Sodium: 140 mmol/L (ref 135–145)

## 2019-10-20 LAB — TSH: TSH: 0.702 u[IU]/mL (ref 0.350–4.500)

## 2019-10-20 LAB — BRAIN NATRIURETIC PEPTIDE: B Natriuretic Peptide: 293.7 pg/mL — ABNORMAL HIGH (ref 0.0–100.0)

## 2019-10-20 MED ORDER — ENTRESTO 49-51 MG PO TABS
1.0000 | ORAL_TABLET | Freq: Two times a day (BID) | ORAL | 3 refills | Status: DC
Start: 2019-10-20 — End: 2019-12-16

## 2019-10-20 NOTE — Progress Notes (Signed)
ADVANCED HF CLINIC CONSULT NOTE  Referring Physician: Dr. Harriet Masson Primary Care: Dr. Bertram Millard [facility])  Primary Cardiologist: Dr. Harriet Masson  HPI:  Krista Jenkins a 59 y.o.femalewith a HL, chronic back pain and systolic HF due to NICM referred by Dr. Harriet Masson for further evaluation of her HF.  In November was coughing and went to PCP with cough and SOB. Diagnosed with bronchitis/PNA. No f/c. Was sob with bad cough. Presented to [facility] in 12/20 with new onset HF. EF 20-25% on echo. Transferred to [facility] for cath. Cath with EF < 25% and normal cors. Pulmonary capillary wedge pressure was 15 mmHg and a mean pulmonary pressure was 18 mmHg,cardiac output 4.9 L with a cardiac index of 2.5.   Underwent cardiac MRI on July 23, 2019 which showed a ejection fraction 16% with no specific late enhancement gadolinium that suggest any type of specific processes. Mild MR. Normal RV  Repeat echo 10/01/19 EF < 20% normal RV.   No h/o HTN. No FHx of HF.   Currently on Entresto 24/26 bid, Farxiga and carvedilol 3.125 bid. No loop diuretic  Here with her husband. Says she is pretty much back to normal but has to take more breaks than before. 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 Going to CR at [place]. Can do 45 min of cardio with TM/bike + weights. No edema, orthopnea or PND. Denies snoring. Gets dizzy for a second when she lies down but not otherwise. Had an episode 2 weeks when she was at the salon and felt like she was going to pass out. No palpitations.  Wearing LifeVest and it did not go off. Device interrogated and no events.    Review of Systems: [y] = yes, [ ]  = no   General: Weight gain [ ] ; Weight loss [ ] ; Anorexia [ ] ; Fatigue Blue.Reese ]; Fever [ ] ; Chills [ ] ; Weakness [ ]   Cardiac: Chest pain/pressure [ ] ; Resting SOB [ ] ; Exertional SOB [ ] ; Orthopnea [ ] ; Pedal Edema [ ] ; Palpitations [ ] ; Syncope [ ] ; Presyncope [ ] ; Paroxysmal nocturnal dyspnea[ ]   Pulmonary: Cough [ ] ; Wheezing[ ] ;  Hemoptysis[ ] ; Sputum [ ] ; Snoring [ ]   GI: Vomiting[ ] ; Dysphagia[ ] ; Melena[ ] ; Hematochezia [ ] ; Heartburn[ ] ; Abdominal pain [ ] ; Constipation [ ] ; Diarrhea [ ] ; BRBPR [ ]   GU: Hematuria[ ] ; Dysuria [ ] ; Nocturia[ ]   Vascular: Pain in legs with walking [ ] ; Pain in feet with lying flat [ ] ; Non-healing sores [ ] ; Stroke [ ] ; TIA [ ] ; Slurred speech [ ] ;  Neuro: Headaches[ ] ; Vertigo[ ] ; Seizures[ ] ; Paresthesias[ ] ;Blurred vision [ ] ; Diplopia [ ] ; Vision changes [ ]   Ortho/Skin: Arthritis [ ] ; Joint pain [ ] ; Muscle pain [ ] ; Joint swelling [ ] ; Back Pain Blue.Reese ]; Rash [ ]   Psych: Depression[ ] ; Anxiety[ ]   Heme: Bleeding problems [ ] ; Clotting disorders [ ] ; Anemia [ ]   Endocrine: Diabetes [ ] ; Thyroid dysfunction[ ]    Past Medical History:  Diagnosis Date  . ACC/AHA stage C systolic heart failure ([place]) 08/21/2019  . Acute systolic CHF (congestive heart failure) ([place]) 06/10/2019  . Asthma 05/16/2018  . Cervical radiculopathy 06/27/2018  . Chronic back pain 05/16/2018  . Degeneration of lumbar intervertebral disc 06/27/2018  . Depressed left ventricular ejection fraction   . Heart failure with reduced ejection fraction ([place])   . Hyperlipidemia   . Lumbar radiculopathy 06/28/2018  . Mixed hyperlipidemia 08/21/2019  . Neck  pain 05/16/2018  . Nonischemic cardiomyopathy (HCC)   . 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 Scoliosis deformity of spine 07/17/2018    Current Outpatient Medications  Medication Sig Dispense Refill  . amitriptyline (ELAVIL) 25 MG tablet Take 50 mg by mouth at bedtime.    Marland Kitchen atorvastatin (LIPITOR) 40 MG tablet Take 1 tablet (40 mg total) by mouth daily at 6 PM. 90 tablet 3  . carvedilol (COREG) 3.125 MG tablet Take 1 tablet (3.125 mg total) by mouth 2 (two) times daily with a meal. 180 tablet 3  . citalopram (CELEXA) 10 MG tablet Take 10 mg by mouth daily.    . dapagliflozin propanediol (FARXIGA) 10 MG TABS tablet Take 10 mg by  mouth daily before breakfast. 30 tablet 5  . sacubitril-valsartan (ENTRESTO) 24-26 MG Take 1 tablet by mouth 2 (two) times daily. 60 tablet 5   No current facility-administered medications for this encounter.    Allergies  Allergen Reactions  . Latex       Social History   Socioeconomic History  . Marital status: Unknown    Spouse name: Not on file  . Number of children: Not on file  . Years of education: Not on file  . Highest education level: Not on file  Occupational History  . Not on file  Tobacco Use  . Smoking status: Former Games developer  . Smokeless tobacco: Never Used  Substance and Sexual Activity  . Alcohol use: Not Currently  . Drug use: Not on file  . Sexual activity: Not on file  Other Topics Concern  . Not on file  Social History Narrative  . Not on file   Social Determinants of Health   Financial Resource Strain:   . Difficulty of Paying Living Expenses:   Food Insecurity:   . Worried About Programme researcher, broadcasting/film/video in the Last Year:   . Barista in the Last Year:   Transportation Needs:   . Freight forwarder (Medical):   Marland Kitchen Lack of Transportation (Non-Medical):   Physical Activity:   . Days of Exercise per Week:   . Minutes of Exercise per Session:   Stress:   . Feeling of Stress :   Social Connections:   . Frequency of Communication with Friends and Family:   . Frequency of Social Gatherings with Friends and Family:   . Attends Religious Services:   . Active Member of Clubs or Organizations:   . Attends Banker Meetings:   Marland Kitchen Marital Status:   Intimate Partner Violence:   . Fear of Current or Ex-Partner:   . Emotionally Abused:   Marland Kitchen Physically Abused:   . Sexually Abused:       Family History  Problem Relation Age of Onset  . CAD Father   . Hypertension Brother     Vitals:   10/20/19 1457  BP: 116/76  Pulse: 96  SpO2: 100%  Weight: 84.7 kg (186 lb 12.8 oz)    PHYSICAL EXAM: General:  Well appearing. No  respiratory difficulty HEENT: normal Neck: supple. no JVD. Carotids 2+ bilat; no bruits. No lymphadenopathy or thryomegaly appreciated. Cor: PMI nondisplaced. Regular. Mildly tachy No rubs, gallops or murmurs. Lungs: clear Abdomen: soft, nontender, nondistended. No hepatosplenomegaly. No bruits or masses. Good bowel sounds. Extremities: no cyanosis, clubbing, rash, edema Neuro: alert & oriented x 3, cranial nerves grossly intact. moves all 4 extremities w/o difficulty. Affect pleasant.  ECG: NSR 94 LVH Personally  reviewed   ASSESSMENT & PLAN:  1. 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 EF < 20% - cMRI 1/21 LVEF 16% with no LGE Mild MR. Normal RV - Echo 10/01/19 EF < 20% normal RV.  - symptomatically improved NYHA II. Volume status looks good - Increase Entresto to 49/51 bid - Continue carvedilol 3.125 bid - Continue Farxiga 10 - Refer to PharmD clinic for further med titration including addition of spiro - Etiology of NICM unclear. Symptomatically improved with excellent GDMT per Dr. [name] but EF remains severely depressed. Discussed possible need for advanced therapies if not improving. - Continue LifeVest. I will see back in 3 months with echo - Told to call me sooner if she is getting any worse  2. Pre-syncopal episode - at high risk for ventricular arrhythmias but no events on Lifevest - continue Lifevest - check labs - discussed possible need for ICD down the road if EF not improved on next echo.   Arvilla Meres, MD  3:41 PM

## 2019-10-20 NOTE — Patient Instructions (Signed)
Increase Entresto to 49/51 mg Twice daily, you can take 2 of your 24/26 mg tabs until you run out  Labs done today, your results will be available in MyChart, we will contact you for abnormal readings.  Please follow up with our heart failure pharmacist in 2-3 weeks  Your physician recommends that you schedule a follow-up appointment in: 3 months with echocardiogram  If you have any questions or concerns before your next appointment please send [place] a message through [place] or call our office at [phone].  At the [facility], you and your health needs are our priority. As part of our continuing mission to provide you with exceptional heart care, we have created designated Provider Care Teams. These Care Teams include your primary Cardiologist (physician) and Advanced Practice Providers (APPs- Physician Assistants and Nurse Practitioners) who all work together to provide you with the care you need, when you need it.   You may see any of the following providers on your designated Care Team at your next follow up: Marland Kitchen Dr Arvilla Meres . Dr Marca Ancona . Tonye Becket, NP . Robbie Lis, PA . Karle Plumber, PharmD   Please be sure to bring in all your medications bottles to every appointment.

## 2019-10-26 ENCOUNTER — Telehealth (HOSPITAL_COMMUNITY): Payer: Self-pay | Admitting: *Deleted

## 2019-10-26 ENCOUNTER — Telehealth: Payer: Self-pay | Admitting: Cardiology

## 2019-10-26 NOTE — Telephone Encounter (Signed)
New Message:     Pt says she have been having pain in the back of her leftt leg since last week. She went to [place] ER on Friday(10-23-19). They gave her Steroids, this helped. She does not have any more, now the pain is back. She says she is not sure what is causing the pain, does not know if it is heart related.

## 2019-10-26 NOTE — Telephone Encounter (Signed)
Pts husband called to report pts bp is 89/67 and she is having chest pain/heaviness. No shortness of breath. Chest pain has been going on for about half an hour and has not improved. Pt thought it might be indigestion/heart burn antiacid medication did not help. I spoke with Dr.Bensimhons RN for today Marisa Hua she advised pt go to the ER for work up. Pt and husband aware and agreeable.

## 2019-10-27 NOTE — Telephone Encounter (Signed)
Spoke to the patient just now and let her know Dr. Barnetta Hammersmith recommendations. She verbalizes understanding and does not have any other questions or concerns.    Encouraged patient to call back with any questions or concerns.

## 2019-10-27 NOTE — Telephone Encounter (Signed)
Based on what has been described, I do not believe this is heart related. Please have her see her pcp especaily since it responded to steriods. In the meantime, it is ok for her to try tylenol. No other NSAIDs

## 2019-10-27 NOTE — Telephone Encounter (Signed)
Follow up  Pt called back to follow up her inquiry yesterday  Please call

## 2019-11-09 ENCOUNTER — Telehealth: Payer: Self-pay | Admitting: Cardiology

## 2019-11-09 NOTE — Telephone Encounter (Signed)
Spoke with Dr. Dulce Sellar just now regarding this patients concerns. Dr. Dulce Sellar recommends that this patient either be seen by her PCP or go to Urgent care to be checked out at this time. She verbalizes understanding of this and no other issues or concerns were noted at this time.

## 2019-11-09 NOTE — Telephone Encounter (Signed)
New message  STAT if HR is under 50 or over 120 (normal HR is 60-100 beats per minute)  1) What is your heart rate? 114   2) Do you have a log of your heart rate readings (document readings)? 110-125 (flucuating)   3) Do you have any other symptoms? Dizziness when getting up, mild fever, nausea, vomiting, stomach pain, not able to hold anything down.

## 2019-11-11 ENCOUNTER — Encounter (HOSPITAL_COMMUNITY): Payer: Self-pay | Admitting: Emergency Medicine

## 2019-11-11 ENCOUNTER — Telehealth: Payer: Self-pay

## 2019-11-11 ENCOUNTER — Emergency Department (HOSPITAL_COMMUNITY): Payer: Medicaid Other

## 2019-11-11 ENCOUNTER — Emergency Department (HOSPITAL_COMMUNITY)
Admission: EM | Admit: 2019-11-11 | Discharge: 2019-11-11 | Disposition: A | Discharge status: Home / Self Care | Payer: Medicaid Other | Attending: Emergency Medicine | Admitting: Emergency Medicine

## 2019-11-11 DIAGNOSIS — R61 Generalized hyperhidrosis: Secondary | ICD-10-CM | POA: Insufficient documentation

## 2019-11-11 DIAGNOSIS — I5022 Chronic systolic (congestive) heart failure: Secondary | ICD-10-CM

## 2019-11-11 DIAGNOSIS — Z87891 Personal history of nicotine dependence: Secondary | ICD-10-CM | POA: Insufficient documentation

## 2019-11-11 DIAGNOSIS — R000 Tachycardia, unspecified: Secondary | ICD-10-CM

## 2019-11-11 DIAGNOSIS — R002 Palpitations: Secondary | ICD-10-CM

## 2019-11-11 DIAGNOSIS — I428 Other cardiomyopathies: Secondary | ICD-10-CM

## 2019-11-11 DIAGNOSIS — I5021 Acute systolic (congestive) heart failure: Secondary | ICD-10-CM | POA: Insufficient documentation

## 2019-11-11 LAB — BASIC METABOLIC PANEL
Anion gap: 10 (ref 5–15)
BUN: 10 mg/dL (ref 6–20)
CO2: 27 mmol/L (ref 22–32)
Calcium: 9.4 mg/dL (ref 8.9–10.3)
Chloride: 103 mmol/L (ref 98–111)
Creatinine, Ser: 0.84 mg/dL (ref 0.44–1.00)
GFR calc Af Amer: >60 mL/min (ref >60)
GFR calc non Af Amer: >60 mL/min (ref >60)
Glucose, Bld: 113 mg/dL — ABNORMAL HIGH (ref 70–99)
Potassium: 4.6 mmol/L (ref 3.5–5.1)
Sodium: 140 mmol/L (ref 135–145)

## 2019-11-11 LAB — CBC
HCT: 46.9 % — ABNORMAL HIGH (ref 36.0–46.0)
Hemoglobin: 14.9 g/dL (ref 12.0–15.0)
MCH: 29.6 pg (ref 26.0–34.0)
MCHC: 31.8 g/dL (ref 30.0–36.0)
MCV: 93.2 fL (ref 80.0–100.0)
Platelets: 235 10*3/uL (ref 150–400)
RBC: 5.03 MIL/uL (ref 3.87–5.11)
RDW: 13 % (ref 11.5–15.5)
WBC: 4.4 10*3/uL (ref 4.0–10.5)
nRBC: 0 % (ref 0.0–0.2)

## 2019-11-11 LAB — BRAIN NATRIURETIC PEPTIDE: B Natriuretic Peptide: 196.1 pg/mL — ABNORMAL HIGH (ref 0.0–100.0)

## 2019-11-11 LAB — TROPONIN I (HIGH SENSITIVITY)
Troponin I (High Sensitivity): 6 ng/L (ref <18)
Troponin I (High Sensitivity): 8 ng/L (ref <18)

## 2019-11-11 MED ORDER — SODIUM CHLORIDE 0.9% FLUSH: 3.0000 mL | Freq: Once | INTRAVENOUS | Status: DC

## 2019-11-11 MED ORDER — DIGOXIN 125 MCG PO TABS
0.1250 mg | ORAL_TABLET | Freq: Every day | ORAL | 2 refills | Status: DC
Start: 2019-11-11 — End: 2020-01-27

## 2019-11-11 NOTE — Discharge Instructions (Signed)
You were seen in the emergency department today with elevated heart rate.  The cardiology team saw you and they will be starting a new medication, digoxin, which they are calling into your pharmacy.  You can pick that up later today and take as directed.  Please follow closely with your cardiologist.  Return to the emergency department any new or suddenly worsening symptoms.

## 2019-11-11 NOTE — Telephone Encounter (Signed)
Pt walked in after being seen at [facility]).  Pt states she wasn't able to participate in Rehab due today to increased heart rate.  Pt was pale, diaphoretic and skin felt clammy.  HR 120's Bp 142/102, patient denies shortness of breath and is wearing a Lifevest due history of decreased ejection fraction and class 3 HeartFailure.  Discussed with Dr Tomie China who agreed with call to 911 for transport to hospital.  Pt denies symptoms or exposure to Covid 19, she was wearing a mask. Dr Dulce Sellar in to see patient and requested transport to [facility] ER due to patients history. [facility] agreed to transport to [facility], stated they would manage notification of enroute to [facility] ED. Pt declined Clinical research associate contact husband, stated she would notify him.

## 2019-11-11 NOTE — ED Triage Notes (Signed)
Pt here from MD office / cardiac rehab for ST , pt has been newly dx with chf , NSR on arrival

## 2019-11-11 NOTE — Consult Note (Addendum)
Cardiology Consult:   Patient ID: Krista Jenkins MRN: [id number]; DOB: [birth year]   Admission date: 11/11/2019  Primary Care Provider: [name], MD Primary Cardiologist: Thomasene Ripple, DO / Arvilla Meres, MD Primary Electrophysiologist:  None   Chief Complaint: tachycardia  Patient Profile:   Krista Jenkins is a 58 y.o. female with chronic systolic CHF/ non-ischemic cardiomyopathy with EF <20% wearing a Life Vest, hyperlipidemia, and chronic back pain who is is being seen for tachycardia at the request of Dr. Jacqulyn Bath (Emergency Department).  History of Present Illness:   Krista Jenkins is a 59 year old female with the above history who is followed by Dr. [name] and Dr. Gala Romney.   Patient was seen by PCP in 04/2019 for cough and shortness of breath and diagnosed with bronchitis/pnuemonia. She then presented to [facility] in 05/2019 with new onset CHF. She was found to have a EF of 20-25% on Echo and was transferred to [facility] for further evaluation. Cardiac catheterization showed EF <25% and normal coronaries with pulmonary capillary wedge pressure of 15 mmHg, mean pulmonary pressure of 18 mmHg,and cardiac output of 4.9L with a cardiac index of 2.5. She underwent cardiac MRI on 06/2019 which showed EF of 16% with no specific late enhancement gadolinium that suggest any type of specific process. Also noted mild MR and normal RV. Repeat Echo in 09/2019 showed LVEF of <20 with normal RV. Patient recently seen by Dr. Gala Romney on 10/20/2019 at which time she was working with Cardiac Rehab at [facility] and reported being pretty much back to normal. She noted a near syncopal episode a few weeks prior and some dizziness when lying down that only last for a few seconds. She was wearing a LifeVest and states it did not go off. Device was interrogated and there were no events.  Currently on Entresto 49-51mg  twice daily, Coreg 3.125mg  twice daily, Spironolactone 12.5mg  daily, and Farxiga 10mg  daily.    Patient in her usual state of health until Monday when she developed a 24 hour GI bug with abdominal, nausea, and vomiting. No diarrhea. She also states she had a low grade fever around 100. Her daughter also had similar symptoms. On Monday, she noted that her heart rate was in the 110's to 120's but she just thought this was because she was sick. She felt okay yesterday. She woke up this morning and checked her heart rate on a home pulse oximeter and her heart rate was 114 bpm a rest. She presented to Cardiac Rehab and was noted to be in sinus tachycardia. She was not allowed to exercise with her heart rate. She states when she left Cardiac Rehab, she had she felt clammy and sweaty and states her hair was wet. This made her nervous so she self-presented to our office where she was then sent to the ED for further evaluation.  In the ED, patient hypertensive with BP as high as 159/136 and tachycardic. EKG showed sinus tachycardia, rate 114 bpm, with biatrial enlargement, left axis deviation, and incomplete RBBB. Chest x-ray showed fine interstitial opacities suspicious for mild pulmonary edema. High-sensitivity troponin negative x2. BNP 196. WBC 4.4, Hgb 14.9, Plts 235. Na 140, K 4.6, Glucose 113, BUN 10, Cr 0.84.   At the time of this evaluation, patient resting comfortably. Heart rates mostly in the 90's with occasional brief spikes in the 100's to low 110's. She states she feels tired but otherwise asymptomatic. Occasionally can feel her heart rate but nothing right now. She occasionally has a  little dizziness when standing up and has to change positions slowly. No chest pain, shortness of breath, orthopnea, PND, edema, syncope.   Past Medical History:  Diagnosis Date  . ACC/AHA stage C systolic heart failure ([place]) 08/21/2019  . Acute systolic CHF (congestive heart failure) ([place]) 06/10/2019  . Asthma 05/16/2018  . Cervical radiculopathy 06/27/2018  . Chronic back pain 05/16/2018  . Degeneration of  lumbar intervertebral disc 06/27/2018  . Depressed left ventricular ejection fraction   . Heart failure with reduced ejection fraction ([place])   . Hyperlipidemia   . Lumbar radiculopathy 06/28/2018  . Mixed hyperlipidemia 08/21/2019  . Neck pain 05/16/2018  . Nonischemic cardiomyopathy ([place])   . NYHA class 3 heart failure with reduced ejection fraction ([place]) 08/21/2019  . Scoliosis deformity of spine 07/17/2018    Past Surgical History:  Procedure Laterality Date  . CESAREAN SECTION    . RIGHT/LEFT HEART CATH AND CORONARY ANGIOGRAPHY N/A 06/11/2019   Procedure: RIGHT/LEFT HEART CATH AND CORONARY ANGIOGRAPHY;  Surgeon: Jettie Booze, MD;  Location: [place] [facility];  Service: Cardiovascular;  Laterality: N/A;     Medications Prior to Admission: Prior to Admission medications   Medication Sig Start Date End Date Taking? Authorizing Provider  amitriptyline (ELAVIL) 25 MG tablet Take 50 mg by mouth at bedtime.    [provider]  atorvastatin (LIPITOR) 40 MG tablet Take 1 tablet (40 mg total) by mouth daily at 6 PM. 06/12/19   Kroeger, Lorelee Cover., PA-C  carvedilol (COREG) 3.125 MG tablet Take 1 tablet (3.125 mg total) by mouth 2 (two) times daily with a meal. 06/12/19   Kroeger, Daleen Snook M., PA-C  citalopram (CELEXA) 10 MG tablet Take 10 mg by mouth daily.    [provider]  dapagliflozin propanediol (FARXIGA) 10 MG TABS tablet Take 10 mg by mouth daily before breakfast. 08/21/19   Tobb, Kardie, DO  predniSONE (DELTASONE) 5 MG tablet Take 5 mg by mouth daily with breakfast. Dose pak    [provider]  sacubitril-valsartan (ENTRESTO) 49-51 MG Take 1 tablet by mouth 2 (two) times daily. 10/20/19   Louiza Moor, Shaune Pascal, MD     Allergies:    Allergies  Allergen Reactions  . Latex     Social History:   Social History   Socioeconomic History  . Marital status: Unknown    Spouse name: Not on file  . Number of children: Not on file  . Years of education: Not on  file  . Highest education level: Not on file  Occupational History  . Not on file  Tobacco Use  . Smoking status: Former Research scientist (life sciences)  . Smokeless tobacco: Never Used  Substance and Sexual Activity  . Alcohol use: Not Currently  . Drug use: Not on file  . Sexual activity: Not on file  Other Topics Concern  . Not on file  Social History Narrative  . Not on file   Social Determinants of Health   Financial Resource Strain:   . Difficulty of Paying Living Expenses:   Food Insecurity:   . Worried About Charity fundraiser in the Last Year:   . Arboriculturist in the Last Year:   Transportation Needs:   . Film/video editor (Medical):   Marland Kitchen Lack of Transportation (Non-Medical):   Physical Activity:   . Days of Exercise per Week:   . Minutes of Exercise per Session:   Stress:   . Feeling of Stress :   Social Connections:   .  Frequency of Communication with Friends and Family:   . Frequency of Social Gatherings with Friends and Family:   . Attends Religious Services:   . Active Member of Clubs or Organizations:   . Attends Banker Meetings:   Marland Kitchen Marital Status:   Intimate Partner Violence:   . Fear of Current or Ex-Partner:   . Emotionally Abused:   Marland Kitchen Physically Abused:   . Sexually Abused:     Family History:   The patient's family history includes CAD in her father; Hypertension in her brother.    ROS:  Please see the history of present illness.  All other ROS reviewed and negative.     Physical Exam/Data:   Vitals:   11/11/19 1438  BP: 120/85  Pulse: (!) 54  Resp: 14  Temp: 98.1 F (36.7 C)  TempSrc: Oral  SpO2: 98%   No intake or output data in the 24 hours ending 11/11/19 1540 Last 3 Weights 10/20/2019 10/13/2019 10/05/2019  Weight (lbs) 186 lb 12.8 oz 186 lb 186 lb  Weight (kg) 84.732 kg 84.369 kg 84.369 kg     There is no height or weight on file to calculate BMI.   General: 59 y.o. female resting comfortably in no acute distress. HEENT:  Normocephalic and atraumatic. Sclera clear.  Neck: Supple. No carotid bruits. No JVD. Heart: RRR. Distinct S1 and S2. No murmurs, gallops, or rubs. LifeVest in place. Radial and distal pedal pulses 2+ and equal bilaterally. Lungs: No increased work of breathing. Clear to ausculation bilaterally. No wheezes, rhonchi, or rales.  Abdomen: Soft, non-distended, and non-tender to palpation. Bowel sounds present. MSK: Normal strength and tone for age. Extremities: No lower extremity edema.    Skin: Warm and dry. Neuro: Alert and oriented x3. No focal deficits. Psych: Normal affect. Responds appropriately.   EKG:  The ECG that was done was personally reviewed and demonstrates sinus tachycardia, rate 114 bpm, with biatrial enlargement, left axis deviation, and incomplete RBBB  Telemetry: Telemetry was personally reviewed and demonstrates Sinus rhythm with rates in the 90's to 110's.  Relevant CV Studies:  Right/Left Heart Catheterization 06/11/2019:  There is severe left ventricular systolic dysfunction.  The left ventricular ejection fraction is less than 25% by visual estimate.  LV end diastolic pressure is mildly elevated. LVEDP 20 mm Hg.  There is no aortic valve stenosis.  Ao sat 94%, PA sat 66%, mean PA pressure 18 mm Hg, mean PCWP 15 mm Hg; CO 4.9 L/min; CI 2.5  No angiographically apparent CAD.   Nonischemic cardiomyopathy.  Continue aggressive medical therapy.  _______________  Cardiac MRI 07/15/2019: Impressions: 1. Findings consistent with non ischemic DCM. Severe LVE with diffuse hypokinesis EF 16% 2.  Normal RV size and function 3.  Moderate LAE 4.  Mild appearing MR 5. No delayed enhancement in LV myocardium on delayed inversion recovery sequences 6. Parametric parameters not consistent with edema or infiltrative cardiomyopathy. _______________  Echocardiogram 10/01/2019: Impressions: 1. Left ventricular ejection fraction, by estimation, is <20%. The left    ventricle has severely decreased function. The left ventricle has no  regional wall motion abnormalities. The left ventricular internal cavity  size was severely dilated. Left  ventricular diastolic parameters are consistent with Grade III diastolic  dysfunction (restrictive).  2. Right ventricular systolic function is normal. The right ventricular  size is normal. There is normal pulmonary artery systolic pressure.  3. Left atrial size was mildly dilated.  4. The mitral valve is normal in structure. Mild  mitral valve  regurgitation. No evidence of mitral stenosis.  5. The aortic valve is normal in structure. Aortic valve regurgitation is  not visualized. No aortic stenosis is present.  6. The inferior vena cava is normal in size with greater than 50%  respiratory variability, suggesting right atrial pressure of 3 mmHg. _______________  Laboratory Data:  High Sensitivity Troponin:   Recent Labs  Lab 11/11/19 1332  TROPONINIHS 6      Chemistry Recent Labs  Lab 11/11/19 1332  NA 140  K 4.6  CL 103  CO2 27  GLUCOSE 113*  BUN 10  CREATININE 0.84  CALCIUM 9.4  GFRNONAA >60  GFRAA >60  ANIONGAP 10    No results for input(s): PROT, ALBUMIN, AST, ALT, ALKPHOS, BILITOT in the last 168 hours. Hematology Recent Labs  Lab 11/11/19 1332  WBC 4.4  RBC 5.03  HGB 14.9  HCT 46.9*  MCV 93.2  MCH 29.6  MCHC 31.8  RDW 13.0  PLT 235   BNPNo results for input(s): BNP, PROBNP in the last 168 hours.  DDimer No results for input(s): DDIMER in the last 168 hours.   Radiology/Studies:  DG Chest 2 View  Result Date: 11/11/2019 CLINICAL DATA:  CHF. Tachycardia. EXAM: CHEST - 2 VIEW COMPARISON:  Radiograph in CT 06/09/2019 FINDINGS: Cardiomegaly, improved from prior exam. Normal mediastinal contours. Fine interstitial opacities with questionable Kerley B-lines at the bases. No significant pleural effusion. No pneumothorax. No confluent airspace disease. No acute osseous  abnormalities are seen. Calcified splenic cyst in the upper abdomen, stable and benign. IMPRESSION: Cardiomegaly, improved from prior exam. Fine interstitial opacities suspicious for mild pulmonary edema. Electronically Signed   By: Narda Rutherford M.D.   On: 11/11/2019 13:34    Assessment and Plan:   Sinus Tachycardia  - Mild sinus tachycardia with rates in the 90's to low 100's. - Suspect this is physiologic response to being a little volume depleted with recent GI illness.  - Encouraged patient to have something salty to eat and increase PO intake. If still having sinus tachycardia on Friday, patient to call our office.   Chronic Systolic CHF/Non-Ischemic Cardiomyopathy  - Known non-ischemic cardiomyopathy. Normal coronaries on cardiac cath in 05/2019. MRI in 06/2019 showed EF of 16% with no specific late enhancement gadolinium that suggest any type of specific process. Repeat Echo in 09/2019 showed LVEF of <20 with normal RV. - BNP minimally elevated at 196.  - Chest x-ray showed fine interstitial opacities suspicious for mild pulmonary edema.  - Lungs clear on exam and does not appear volume overloaded on exam.  - Continue Entresto 49-51mg  twice daily, Coreg 3.125mg  twice daily, Spironolactone 12.5mg  daily, and Farxiga 10mg  daily.  - Will add Digoxin 0.125mg  daily.  Disposition: OK to be discharged home. Patient knows to call our office if not feeling better on Friday. She also has appointment with PharmD in CHF Clinic next week.    For questions or updates, please contact [facility] Please consult [email or web address] for contact info under     Signed, Corrin Parker, PA-C  11/11/2019 3:40 PM    Patient seen and examined with the above-signed Advanced Practice Provider and/or Housestaff. I personally reviewed laboratory data, imaging studies and relevant notes. I independently examined the patient and formulated the important aspects of the plan. I have edited the note to reflect  any of my changes or salient points. I have personally discussed the plan with the patient and/or family.  59 y/o woman with recent  onset acute, severe systolic HF due to NICM. EF 16% by cMRI. Has been responding well to titration of GDMT going to CR at [facility]. Developed apparent viral gastroenteritis early this week with profuse vomiting. Has been getting better slowly but still with poor po intake. Today noticed persistent tachycardia. Went to Dr. Mallory Shirk office and told to go to the ER for evaluation.   In ER labs ok. HR 100-110 with sinus tach. BP ok   General:  Well appearing. No resp difficulty HEENT: normal Neck: supple. no JVD. Carotids 2+ bilat; no bruits. No lymphadenopathy or thryomegaly appreciated. Cor: PMI nondisplaced. Regular tachy no s3 Lungs: clear Abdomen: soft, nontender, nondistended. No hepatosplenomegaly. No bruits or masses. Good bowel sounds. Extremities: no cyanosis, clubbing, rash, edema Neuro: alert & orientedx3, cranial nerves grossly intact. moves all 4 extremities w/o difficulty. Affect pleasant  Suspect tachycardia related to relative volume depletion in setting of recent GI bug. No s3 on exam. I think she can go home from ER. Will add dig and encourage fluid and salt intake tonight. If not better by Friday I have asked her to call me and we can repeat RHC +/- CPX testing to further evaluate for worsening HF.  Arvilla Meres, MD  7:47 PM

## 2019-11-11 NOTE — ED Notes (Signed)
Patient verbalizes understanding of discharge instructions. Opportunity for questioning and answers were provided. Armband removed by staff, pt discharged from ED.  

## 2019-11-11 NOTE — ED Provider Notes (Signed)
Emergency Department Provider Note   I have reviewed the triage vital signs and the nursing notes.   HISTORY  Chief Complaint No chief complaint on file.   HPI Krista Jenkins is a 59 y.o. female with PMH of NICM with EF < 20% wearing life vest and followed by Dr. Tomie China and Dr. Gala Romney presents to the emergency department with elevated heart rate, diaphoresis, palpitations.  Symptoms have been present over the past 2 days.  She had vomiting 2 days ago with reported subjective fever at that time but the symptoms have resolved.  Her PO intake has started to pick back up but she had approximately 36 hours where she did not eat or drink very much.  She went to cardiac rehab this morning but was feeling some palpitations.  No lightheadedness or near syncope.  She denies chest pain or shortness of breath.  They would not do rehab exercises with her heart rate being elevated and she self presented to her cardiologist office.  She was evaluated briefly and sent to the emergency department by EMS with tachycardia.  Normal blood pressure there.   Past Medical History:  Diagnosis Date  . ACC/AHA stage C systolic heart failure ([facility]) 08/21/2019  . Acute systolic CHF (congestive heart failure) ([facility]) 06/10/2019  . Asthma 05/16/2018  . Cervical radiculopathy 06/27/2018  . Chronic back pain 05/16/2018  . Degeneration of lumbar intervertebral disc 06/27/2018  . Depressed left ventricular ejection fraction   . Heart failure with reduced ejection fraction ([facility])   . Hyperlipidemia   . Lumbar radiculopathy 06/28/2018  . Mixed hyperlipidemia 08/21/2019  . Neck pain 05/16/2018  . Nonischemic cardiomyopathy ([facility])   . 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 Scoliosis deformity of spine 07/17/2018    Patient Active Problem List   Diagnosis Date Noted  . ACC/AHA stage C systolic heart failure ([facility]) 08/21/2019  . Mixed hyperlipidemia 08/21/2019  . Heart failure with reduced  ejection fraction ([facility]) 08/21/2019  . 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 Depressed left ventricular ejection fraction 08/21/2019  . Acute systolic CHF (congestive heart failure) ([facility]) 06/10/2019  . Scoliosis deformity of spine 07/17/2018  . Lumbar radiculopathy 06/28/2018  . Cervical radiculopathy 06/27/2018  . Degeneration of lumbar intervertebral disc 06/27/2018  . Chronic back pain 05/16/2018  . Asthma 05/16/2018  . Neck pain 05/16/2018    Past Surgical History:  Procedure Laterality Date  . CESAREAN SECTION    . RIGHT/LEFT HEART CATH AND CORONARY ANGIOGRAPHY N/A 06/11/2019   Procedure: RIGHT/LEFT HEART CATH AND CORONARY ANGIOGRAPHY;  Surgeon: Corky Crafts, MD;  Location: [facility] INVASIVE CV LAB;  Service: Cardiovascular;  Laterality: N/A;    Allergies Latex  Family History  Problem Relation Age of Onset  . CAD Father   . Hypertension Brother     Social History Social History   Tobacco Use  . Smoking status: Former Games developer  . Smokeless tobacco: Never Used  Substance Use Topics  . Alcohol use: Not Currently  . Drug use: Not on file    Review of Systems  Constitutional: No fever/chills Eyes: No visual changes. ENT: No sore throat. Cardiovascular: Denies chest pain. Positive palpitations and diaphoresis.  Respiratory: Denies shortness of breath. Gastrointestinal: No abdominal pain.  Positive nausea and vomiting (resolved).  No diarrhea.  No constipation. Genitourinary: Negative for dysuria. Musculoskeletal: Negative for back pain. Skin: Negative for rash. Neurological: Negative for headaches, focal weakness or  numbness.  10-point ROS otherwise negative.  ____________________________________________   PHYSICAL EXAM:  VITAL SIGNS: ED Triage Vitals  Enc Vitals Group     BP 11/11/19 1438 120/85     Pulse Rate 11/11/19 1438 (!) 54     Resp 11/11/19 1438 14     Temp 11/11/19 1438 98.1 F (36.7 C)     Temp  Source 11/11/19 1438 Oral     SpO2 11/11/19 1438 98 %   Constitutional: Alert and oriented. Well appearing and in no acute distress. Eyes: Conjunctivae are normal.  Head: Atraumatic. Nose: No congestion/rhinnorhea. Mouth/Throat: Mucous membranes are moist.  Neck: No stridor.  Cardiovascular: Sinus tachycardia. Good peripheral circulation. Grossly normal heart sounds.   Respiratory: Normal respiratory effort.  No retractions. Lungs CTAB. Gastrointestinal: Soft and nontender. No distention.  Musculoskeletal: No lower extremity tenderness nor edema. No gross deformities of extremities. Neurologic:  Normal speech and language.  Skin:  Skin is warm, dry and intact. No rash noted.  ____________________________________________   LABS (all labs ordered are listed, but only abnormal results are displayed)  Labs Reviewed  BASIC METABOLIC PANEL - Abnormal; Notable for the following components:      Result Value   Glucose, Bld 113 (*)    All other components within normal limits  CBC - Abnormal; Notable for the following components:   HCT 46.9 (*)    All other components within normal limits  BRAIN NATRIURETIC PEPTIDE - Abnormal; Notable for the following components:   B Natriuretic Peptide 196.1 (*)    All other components within normal limits  TROPONIN I (HIGH SENSITIVITY)  TROPONIN I (HIGH SENSITIVITY)   ____________________________________________  EKG   EKG Interpretation  Date/Time:  Wednesday Nov 11 2019 13:36:22 EDT Ventricular Rate:  114 PR Interval:  176 QRS Duration: 96 QT Interval:  336 QTC Calculation: 463 R Axis:   -53 Text Interpretation: Sinus tachycardia Biatrial enlargement Left axis deviation Incomplete right bundle branch block Cannot rule out Anterior infarct , age undetermined Abnormal ECG No STEMI Confirmed by Alona Bene [phone]) on 11/11/2019 2:59:50 PM       ____________________________________________  RADIOLOGY  DG Chest 2 View  Result Date:  11/11/2019 CLINICAL DATA:  CHF. Tachycardia. EXAM: CHEST - 2 VIEW COMPARISON:  Radiograph in CT 06/09/2019 FINDINGS: Cardiomegaly, improved from prior exam. Normal mediastinal contours. Fine interstitial opacities with questionable Kerley B-lines at the bases. No significant pleural effusion. No pneumothorax. No confluent airspace disease. No acute osseous abnormalities are seen. Calcified splenic cyst in the upper abdomen, stable and benign. IMPRESSION: Cardiomegaly, improved from prior exam. Fine interstitial opacities suspicious for mild pulmonary edema. Electronically Signed   By: Narda Rutherford M.D.   On: 11/11/2019 13:34    ____________________________________________   PROCEDURES  Procedure(s) performed:   Procedures  None  ____________________________________________   INITIAL IMPRESSION / ASSESSMENT AND PLAN / ED COURSE  Pertinent labs & imaging results that were available during my care of the patient were reviewed by me and considered in my medical decision making (see chart for details).   Patient presents to the emergency department with heart palpitations and diaphoresis this morning.  No chest pain but found to have sinus tachycardia at her cardiologist office.  She has nonischemic cardiomyopathy with low EF and is wearing a LifeVest.  She appears euvolemic overall.  Had recent vomiting illness 2 days ago but that has resolved and abdomen is non-tender.  She is unsure of her dry weight.  Reviewed her home medicines.  Given the patient's severe underlying CHF plan for cardiology consultation to assist with any possible med adjustments as patient is still symptomatic here although symptoms are improved.   05:39 PM  Spoke with Dr. Haroldine Laws after their evaluation of the patient.  They will be calling in a prescription for digoxin and the patient will pick that up at the pharmacy.  No additional intervention here.  She is ready for discharge.    ____________________________________________  FINAL CLINICAL IMPRESSION(S) / ED DIAGNOSES  Final diagnoses:  Palpitations  Sinus tachycardia    MEDICATIONS GIVEN DURING THIS VISIT:  Medications  sodium chloride flush (NS) 0.9 % injection 3 mL (has no administration in time range)    Note:  This document was prepared using Dragon voice recognition software and may include unintentional dictation errors.  Nanda Quinton, MD, [facility] Emergency Medicine    Raylie Maddison, [facility], MD 11/11/19 [phone]

## 2019-11-17 ENCOUNTER — Ambulatory Visit (HOSPITAL_COMMUNITY)
Admission: RE | Admit: 2019-11-17 | Discharge: 2019-11-17 | Disposition: A | Discharge status: Home / Self Care | Payer: Self-pay | Source: Ambulatory Visit | Attending: Internal Medicine | Admitting: Internal Medicine

## 2019-11-17 ENCOUNTER — Other Ambulatory Visit: Payer: Self-pay

## 2019-11-17 ENCOUNTER — Encounter (HOSPITAL_COMMUNITY): Payer: Self-pay

## 2019-11-17 ENCOUNTER — Telehealth: Payer: Self-pay | Admitting: Cardiology

## 2019-11-17 VITALS — BP 122/68 | HR 87 | Ht 66.0 in | Wt 188.2 lb

## 2019-11-17 DIAGNOSIS — I5021 Acute systolic (congestive) heart failure: Secondary | ICD-10-CM

## 2019-11-17 DIAGNOSIS — I5020 Unspecified systolic (congestive) heart failure: Secondary | ICD-10-CM

## 2019-11-17 DIAGNOSIS — I428 Other cardiomyopathies: Secondary | ICD-10-CM | POA: Insufficient documentation

## 2019-11-17 DIAGNOSIS — R55 Syncope and collapse: Secondary | ICD-10-CM | POA: Insufficient documentation

## 2019-11-17 DIAGNOSIS — Z79899 Other long term (current) drug therapy: Secondary | ICD-10-CM | POA: Insufficient documentation

## 2019-11-17 DIAGNOSIS — I5022 Chronic systolic (congestive) heart failure: Secondary | ICD-10-CM | POA: Insufficient documentation

## 2019-11-17 MED ORDER — SPIRONOLACTONE 25 MG PO TABS: 25.0000 mg | ORAL_TABLET | Freq: Every day | ORAL | 1 refills | Status: DC

## 2019-11-17 NOTE — Progress Notes (Signed)
Referring Physician: Dr. [name] Primary Care: Dr. Luna Kitchens [facility])  Primary Cardiologist: Dr. [name]  HPI: Krista Jenkins a 59 y.o.femalewith a HL, chronic back pain and systolic HF due to NICM referred by Dr. [name] for further evaluation of her HF.  In November was coughing and went to PCP with cough and SOB. Diagnosed with bronchitis/PNA. No f/c. Was sob with bad cough. Presented to [facility] in 12/20 with new onset HF. EF 20-25% on echo. Transferred to [facility] for cath. Cath with EF < 25% and normal cors. Pulmonary capillary wedge pressure was 15 mmHg and a mean pulmonary pressure was 18 mmHg,cardiac output 4.9 L with a cardiac index of 2.5.   Underwent cardiac MRI on January 28, 2021which showed a ejection fraction 16% with no specific late enhancement gadolinium that suggest any type of specific processes. Mild MR. Normal RV.  Repeat echo 10/01/19 EF < 20% normal RV.   No h/o HTN. No FHx of HF.   At last HF clinic visit with Dr. Gala Romney on 10/20/19, she said that she was pretty much back to normal but had to take more breaks than before. 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 She goes to cardiac rehab at [place]. She was able to do 45 min of cardio with TM/bike + weights. No edema, orthopnea or PND. Denied snoring. Endorses dizziness for a second when she lies down but not otherwise. Had an episode when she was at the salon and felt like she was going to pass out. No palpitations. Wearing LifeVest and it did not go off. Device interrogated and no events.   She also had a recent ED visit on 11/11/19 with palpations and sinus tachycardia which was thought to be related to a 24 hr GI bug with abdominal pain, nausea, and vomiting. Digoxin was initiated.   Today she returns to HF clinic for pharmacist medication titration. At last visit with MD, [name] was increased to 49/51 mg BID. Of note, since this last visit, she self-resumed  Spironolactone because her BP had been better at home.  Spironolactone was previously held for ~1 month due to low BP. Symptomatically, she is feeling really improved. Denies dizziness, lightheadedness, and fatigue. States that her SBP at home has been 120-130s over the last week since her recent ED visit. No chest pain or palpitations since her recent ED visit. Gets SOB when walking up stairs but no SOB on flat ground. She is able to complete all ADLs. Activity level improving with cardiac rehab exercises. She endorses significant improvement in quality of life. She weighs herself daily at home (normal range 186-188lbs). She has not needed any diuretic. No LEE. No PND/Orthopnea. Appetite has been good recently; she has been eating more fruits and vegetables. Adheres to a low-salt diet. Discussed low potassium diet today.   HF Medications: Carvedilol 3.125 mg BID  Entresto 49/51 mg BID  Spironolactone 12.5 mg daily Dapagliflozin 10 mg daily Digoxin 0.125 mg daily  Has the patient been experiencing any side effects to the medications prescribed?  no  Does the patient have any problems obtaining medications due to transportation or finances?   Yes - no prescription insurance. Gets manufacturer's assistance for [employer] and dapagliflozin  Understanding of regimen: good Understanding of indications: good Potential of compliance: good Patient understands to avoid NSAIDs. Patient understands to avoid decongestants.    Pertinent Lab Values (11/11/19): Marland Kitchen Serum creatinine 0.84, BUN 10, Potassium 4.6, Sodium 140, BNP 196 pg/mL  Vital Signs: . Weight: 188.2 lbs (last  clinic weight: 186 lbs) . Blood pressure: 122/68  . Heart rate: 87   Assessment: 1. 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 EF < 20% - cMRI 1/21 LVEF 16% with no LGE Mild MR. Normal RV - Echo 10/01/19 EF < 20% normal RV.  - Symptomatically improved, NYHA II  - Volume status is stable on exam today - Vitals: BP 122/68, HR 87 - Continue carvedilol 3.125 mg BID -  Continue Entresto 49/51 mg BID - Increase spironolactone to 25 mg daily. Check BMET in 1 week.  - Continue dapagliflozin 10 mg daily - Continue digoxin 0.125 mg daily. Plan to check digoxin level in 1 week.  - Continue LifeVest. Repeat ECHO in 3 months (12/2019)  2. Pre-syncopal episode - At high risk for ventricular arrhythmias but no events on Lifevest - Continue Lifevest - Discussed possible need for ICD down the road if EF not improved on next echo   Plan: 1) Medication changes: Based on clinical presentation, vital signs and recent labs will increase spironolactone to 25 mg daily. Check BMET in 1 week. 2) Follow-up: BMET and digoxin level in 1 week, pharmacy clinic visit in 4 weeks, & 2 months with Dr. Haroldine Laws for repeat ECHO - Can consider increasing Entresto at next visit if BP remains stable  Kennon Holter, PharmD PGY1 Ambulatory Care Pharmacy Resident  Audry Riles, PharmD, BCPS, BCCP, CPP Heart Failure Clinic Pharmacist [phone]

## 2019-11-17 NOTE — Patient Instructions (Addendum)
It was a pleasure seeing you today!  MEDICATIONS: -We are changing your medications today -Increase spironolactone to 25 mg daily -Prior to your lab appointment in 1 week, do not take your digoxin until after this lab visit so we can check an accurate digoxin level. You can take all of your other medications as normal on this day.  -Call if you have questions about your medications.  NEXT APPOINTMENT: Return to clinic in 1 week for labs and 4 weeks to see pharmacy  In general, to take care of your heart failure: -Limit your fluid intake to 2 Liters (half-gallon) per day.   -Limit your salt intake to ideally 2-3 grams (2000-3000 mg) per day. -Weigh yourself daily and record, and bring that "weight diary" to your next appointment.  (Weight gain of 2-3 pounds in 1 day typically means fluid weight.) -The medications for your heart are to help your heart and help you live longer.   -Please contact us before stopping any of your heart medications.  Call the clinic at [phone] with questions or to reschedule future appointments.

## 2019-11-17 NOTE — Telephone Encounter (Signed)
New Message    Pt is calling to speak with Jim Like  She says that she is okay and wants to thank her for everything she did the other day

## 2019-11-26 ENCOUNTER — Ambulatory Visit (HOSPITAL_COMMUNITY)
Admission: RE | Admit: 2019-11-26 | Discharge: 2019-11-26 | Disposition: A | Discharge status: Home / Self Care | Payer: Medicaid Other | Source: Ambulatory Visit | Attending: Cardiology | Admitting: Cardiology

## 2019-11-26 ENCOUNTER — Other Ambulatory Visit (HOSPITAL_COMMUNITY): Payer: Self-pay

## 2019-11-26 ENCOUNTER — Other Ambulatory Visit: Payer: Self-pay

## 2019-11-26 DIAGNOSIS — I5022 Chronic systolic (congestive) heart failure: Secondary | ICD-10-CM | POA: Insufficient documentation

## 2019-11-26 LAB — BASIC METABOLIC PANEL
Anion gap: 8 (ref 5–15)
BUN: 15 mg/dL (ref 6–20)
CO2: 27 mmol/L (ref 22–32)
Calcium: 9.3 mg/dL (ref 8.9–10.3)
Chloride: 101 mmol/L (ref 98–111)
Creatinine, Ser: 0.77 mg/dL (ref 0.44–1.00)
GFR calc Af Amer: >60 mL/min (ref >60)
GFR calc non Af Amer: >60 mL/min (ref >60)
Glucose, Bld: 124 mg/dL — ABNORMAL HIGH (ref 70–99)
Potassium: 4.1 mmol/L (ref 3.5–5.1)
Sodium: 136 mmol/L (ref 135–145)

## 2019-11-26 LAB — DIGOXIN LEVEL: Digoxin Level: 0.6 ng/mL — ABNORMAL LOW (ref 0.8–2.0)

## 2019-12-16 ENCOUNTER — Ambulatory Visit (HOSPITAL_COMMUNITY)
Admission: RE | Admit: 2019-12-16 | Discharge: 2019-12-16 | Disposition: A | Discharge status: Home / Self Care | Payer: Medicaid Other | Source: Ambulatory Visit | Attending: Internal Medicine | Admitting: Internal Medicine

## 2019-12-16 ENCOUNTER — Other Ambulatory Visit: Payer: Self-pay

## 2019-12-16 VITALS — BP 110/64 | HR 89 | Wt 187.0 lb

## 2019-12-16 DIAGNOSIS — G8929 Other chronic pain: Secondary | ICD-10-CM | POA: Insufficient documentation

## 2019-12-16 DIAGNOSIS — R55 Syncope and collapse: Secondary | ICD-10-CM | POA: Insufficient documentation

## 2019-12-16 DIAGNOSIS — I5022 Chronic systolic (congestive) heart failure: Secondary | ICD-10-CM | POA: Insufficient documentation

## 2019-12-16 DIAGNOSIS — I428 Other cardiomyopathies: Secondary | ICD-10-CM | POA: Insufficient documentation

## 2019-12-16 DIAGNOSIS — M549 Dorsalgia, unspecified: Secondary | ICD-10-CM | POA: Insufficient documentation

## 2019-12-16 DIAGNOSIS — Z79899 Other long term (current) drug therapy: Secondary | ICD-10-CM | POA: Insufficient documentation

## 2019-12-16 DIAGNOSIS — G729 Myopathy, unspecified: Secondary | ICD-10-CM | POA: Insufficient documentation

## 2019-12-16 DIAGNOSIS — I5020 Unspecified systolic (congestive) heart failure: Secondary | ICD-10-CM

## 2019-12-16 DIAGNOSIS — E785 Hyperlipidemia, unspecified: Secondary | ICD-10-CM | POA: Insufficient documentation

## 2019-12-16 LAB — BASIC METABOLIC PANEL
Anion gap: 10 (ref 5–15)
BUN: 15 mg/dL (ref 6–20)
CO2: 24 mmol/L (ref 22–32)
Calcium: 9.3 mg/dL (ref 8.9–10.3)
Chloride: 106 mmol/L (ref 98–111)
Creatinine, Ser: 0.74 mg/dL (ref 0.44–1.00)
GFR calc Af Amer: >60 mL/min (ref >60)
GFR calc non Af Amer: >60 mL/min (ref >60)
Glucose, Bld: 96 mg/dL (ref 70–99)
Potassium: 4.4 mmol/L (ref 3.5–5.1)
Sodium: 140 mmol/L (ref 135–145)

## 2019-12-16 LAB — CK TOTAL AND CKMB (NOT AT ARMC)
CK, MB: 1.1 ng/mL (ref 0.5–5.0)
Relative Index: INVALID (ref 0.0–2.5)
Total CK: 75 U/L (ref 38–234)

## 2019-12-16 LAB — BRAIN NATRIURETIC PEPTIDE: B Natriuretic Peptide: 57.4 pg/mL (ref 0.0–100.0)

## 2019-12-16 MED ORDER — SACUBITRIL-VALSARTAN 97-103 MG PO TABS: 1.0000 | ORAL_TABLET | Freq: Two times a day (BID) | ORAL | 3 refills | Status: DC

## 2019-12-16 NOTE — Patient Instructions (Addendum)
It was a pleasure seeing you today!  MEDICATIONS: -We are changing your medications today -Increase  Entresto to 97/103 mg (1 tablet) twice daily. You may take 2 tablets of the 49/51 mg twice daily until you get the new strength. -Call if you have questions about your medications.  LABS: -We will call you if your labs need attention.  NEXT APPOINTMENT: Return to clinic in 2 weeks with Pharmacy Clinic.  In general, to take care of your heart failure: -Limit your fluid intake to 2 Liters (half-gallon) per day.   -Limit your salt intake to ideally 2-3 grams (2000-3000 mg) per day. -Weigh yourself daily and record, and bring that "weight diary" to your next appointment.  (Weight gain of 2-3 pounds in 1 day typically means fluid weight.) -The medications for your heart are to help your heart and help you live longer.   -Please contact us before stopping any of your heart medications.  Call the clinic at [phone] with questions or to reschedule future appointments.

## 2019-12-17 NOTE — Progress Notes (Signed)
Referring Physician: Dr. [name] Primary Care: Dr. Luna Kitchens [facility])  Primary Cardiologist: Dr. [name] Heart Failure: Dr. Gala Romney   HPI: Krista Jenkins is a 59 y.o. female with a past medical history of HLD, chronic back pain and systolic HF due to NICM referred by Dr. [name] for further evaluation of her HF.   In November she was coughing and went to PCP with cough and SOB. Diagnosed with bronchitis/PNA. Had SOB with bad cough. Presented to [facility] in 12/20 with new onset HF. EF 20-25% on ECHO. Transferred to [facility] for cath. Cath with EF < 25% and normal coronaries. Pulmonary capillary wedge pressure was 15 mmHg and a mean pulmonary pressure was 18 mmHg, cardiac output 4.9 L with a cardiac index of 2.5.    Underwent cardiac MRI on July 23, 2019 which showed a ejection fraction 16% with no specific late enhancement gadolinium that suggest any type of specific processes. Mild MR. Normal RV.   Repeat echo 10/01/19 EF < 20% normal RV.    No history of HTN or family history of HF.   At last HF clinic visit with Dr. Gala Romney on 10/20/19, she said that she was pretty much back to normal but had to take more breaks than before. 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 She goes to cardiac rehab at [place]. She was able to do 45 min of cardio with TM/bike + weights. No edema, orthopnea or PND. Denied snoring. Endorses dizziness for a second when she lies down but not otherwise. Had an episode when she was at the salon and felt like she was going to pass out. No palpitations. Wearing LifeVest and it did not go off. Device interrogated and no events.    At last HF clinic visit with pharmacy HF clinic on 11/17/2019, she felt much improved. Denied dizziness, lightheadedness, or fatigue. Stated that her SBP at home had been 120-130s. Reported no chest pain or palpitations since her ED visit. No SOB on flat ground but did experience SOB when walking up stairs. Activity level improved with cardiac rehab exercises.  Weights were stable 186-188 lbs. No LEE, PND/Orthopnea.  Today she returns to HF clinic for pharmacist medication titration. At last visit with pharmacist clinic, spironolactone was increased to 25 mg daily. She has tolerated this therapy change well. Biggest complaint is bilateral muscle pain in back of knee/thigh area that has been occurring for weeks. Unsure what is causing this. Unlikely to be from statin as she has been taking this since 05/2019. Has not experienced any other notable adverse effects from her medications. Reports no issues with adherence. Symptomatically, she is feeling well. Denies dizziness, lightheadedness, and fatigue. SBP readings at home have been ranging from 104-135 over the last week. No chest pain or palpitations. She is able to complete all ADLs. Believes cardiac rehab exercises have really improved activity level and wants to continue with this program. She weighs herself daily at home (normal range 186-188 lbs). Slight LEE but no JVD or rales. No PND/Orthopnea. Appetite has been good recently; she has been eating more fruits and vegetables. Adheres to a low-salt diet.   HF Medications: Carvedilol 3.125 mg BID  Entresto 49/51 mg BID  Spironolactone 25 mg daily Dapagliflozin 10 mg daily Digoxin 0.125 mg daily  Has the patient been experiencing any side effects to the medications prescribed?  No, patient experiencing muscle cramps but unclear if due to medications or other cause. Likely not statin induced since patient was tolerating statin therapy for months  prior to this onset. CK obtained and WNLs.   Does the patient have any problems obtaining medications due to transportation or finances?   Yes - no prescription insurance. Gets manufacturer's assistance for [employer] and dapagliflozin  Understanding of regimen: good Understanding of indications: good Potential of compliance: good Patient understands to avoid NSAIDs. Patient understands to avoid decongestants.     Pertinent Lab Values: . Serum creatinine 0.74, BUN 15, Potassium 4.4, Sodium 140, BNP 57.4 pg/mL (improvement from 196 pg/mL), CK 75 U/L  Vital Signs: . Weight: 187 lbs (last clinic weight: 188 lbs) . Blood pressure: 120/64 . Heart rate: 89  Assessment: 1. Chronic systolic HF due to severe NICM - Cath 1/21 normal coronaries. EF < 20% - cMRI 1/21 LVEF 16% with no LGE Mild MR. Normal RV - Echo 10/01/19 EF < 20% normal RV.  - Symptomatically improved, NYHA II  - Volume status is stable on exam today. Weight consistent at home and in clinic. BNP down to 57.4 pg/mL - Vitals: BP 110/64, HR 89 - Continue carvedilol 3.125 mg BID - Increase Entresto to 97/103 mg BID. BMET today was stable. Repeat BMET in 2 weeks.  - Continue spironolactone 25 mg daily. K+ is WNLs at 4.4 today.  - Continue dapagliflozin 10 mg daily - Continue digoxin 0.125 mg daily - Continue LifeVest. Repeat ECHO (12/2019)   2. 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 No notable new medications or drug-drug interactions. Patient appeared to be euvolemic - F/u at next visit to see if pain resolves.    Plan: 1) Medication changes: Based on clinical presentation, vital signs and recent labs will increase Entresto to 97/103 mg BID. Check BMET at next visit. 2) Labs: BMET today is stable. K+ 4.4, Scr. 0.74.  3.) Follow-up: Repeat pharmacy clinic visit in 2 weeks & 1 month with Dr. Haroldine Laws for repeat ECHO  Sherren Kerns, PharmD PGY1 Acute Care Pharmacy Resident  Audry Riles, PharmD, BCPS, [facility], CPP Heart Failure Clinic Pharmacist [phone]

## 2019-12-30 ENCOUNTER — Telehealth: Payer: Self-pay | Admitting: Nurse Practitioner

## 2019-12-30 NOTE — Telephone Encounter (Signed)
   Pt called this evening to report that she developed diarrhea on Monday followed by fever yesterday.  Both have persisted.  She has a h/o NICM and is on carvedilol and entresto and did take both this AM.  BP today was 84/49.  She feels poorly but is not in any distress.  I rec that she have a low threshold to call EMS given fever and hypotension.  She is to hold carvedilol and entresto this evening and if BP remains below 90 syst, I advised that she will need to call EMS and/or present to the ED for eval as I am concerned about the possibility of sepsis.  Pt prefers to see how she feels later but verbalized understanding.  Of note, she has not had the COVID19 vaccine.  She is unaware of sick contacts.  Nicolasa Ducking, NP 12/30/2019, 5:41 PM

## 2019-12-31 ENCOUNTER — Inpatient Hospital Stay (HOSPITAL_COMMUNITY): Admission: RE | Admit: 2019-12-31 | Payer: Medicaid Other | Source: Ambulatory Visit

## 2020-01-04 ENCOUNTER — Ambulatory Visit (INDEPENDENT_AMBULATORY_CARE_PROVIDER_SITE_OTHER): Payer: Self-pay | Admitting: Cardiology

## 2020-01-04 ENCOUNTER — Ambulatory Visit: Payer: Medicaid Other | Admitting: Cardiology

## 2020-01-04 ENCOUNTER — Encounter: Payer: Self-pay | Admitting: Cardiology

## 2020-01-04 ENCOUNTER — Other Ambulatory Visit: Payer: Self-pay

## 2020-01-04 VITALS — BP 112/76 | HR 97 | Ht 66.0 in | Wt 181.4 lb

## 2020-01-04 DIAGNOSIS — I5020 Unspecified systolic (congestive) heart failure: Secondary | ICD-10-CM

## 2020-01-04 DIAGNOSIS — E782 Mixed hyperlipidemia: Secondary | ICD-10-CM

## 2020-01-04 DIAGNOSIS — I428 Other cardiomyopathies: Secondary | ICD-10-CM

## 2020-01-04 NOTE — Patient Instructions (Signed)
Medication Instructions:  No medication changes. *If you need a refill on your cardiac medications before your next appointment, please call your pharmacy*   Lab Work: Your physician recommends that you have a BMET and Magnesium today in the office.  If you have labs (blood work) drawn today and your tests are completely normal, you will receive your results only by: Marland Kitchen MyChart Message (if you have MyChart) OR . A paper copy in the mail If you have any lab test that is abnormal or we need to change your treatment, we will call you to review the results.   Testing/Procedures: None ordered   Follow-Up: At [facility], you and your health needs are our priority.  As part of our continuing mission to provide you with exceptional heart care, we have created designated Provider Care Teams.  These Care Teams include your primary Cardiologist (physician) and Advanced Practice Providers (APPs -  Physician Assistants and Nurse Practitioners) who all work together to provide you with the care you need, when you need it.  We recommend signing up for the patient portal called "MyChart".  Sign up information is provided on this After Visit Summary.  MyChart is used to connect with patients for Virtual Visits (Telemedicine).  Patients are able to view lab/test results, encounter notes, upcoming appointments, etc.  Non-urgent messages can be sent to your provider as well.   To learn more about what you can do with MyChart, go to [email or web address].    Your next appointment:   3 month(s)  The format for your next appointment:   In Person  Provider:   Thomasene Ripple, DO   Other Instructions NA

## 2020-01-04 NOTE — Progress Notes (Signed)
Cardiology Office Note:    Date:  01/04/2020   ID:  Krista Jenkins, DOB [birth year], MRN [id number]  PCP:  Krista Part, MD  Cardiologist:  Krista Ripple, DO  Electrophysiologist:  None   Referring MD: Krista Part, MD     History of Present Illness:     Krista Jenkins a 59 y.o.femalewith a hx of with no reported history presented initially on June 09, 2019 to the [facility] to be evaluated for cough and shortness of breath. Prior to her presentation the patient reported that she has been treated for pneumonia 2 weeks prior.While in the hospital she did get a CT of the chest which was negative for pulmonary embolism but showed moderate pulmonary congestion. She was started on heart failure treatment and an echocardiogram was performed which show depressed ejection fraction of 20 to 25%. Giving this new depressed ejection fraction and anginal pain the patient was transferred to [facility] for left heart catheterization.  While at [facility] she did undergo left heart catheterization which showed normal coronaries. Left ventriculogram did show evidence of ejection fraction less than 25% visually. Pulmonary capillary wedge pressure was 15 mmHg and a mean pulmonary pressure was 18 mmHg,cardiac output 4.9 L with a cardiac index of 2.5. The patient was subsequently discharged oncarvedilol3.125 mg, spironolactone 12.5 mg and atorvastatin 40 mg due to LDL of 135.  I saw the patient on June 23, 2019 at that time she was here to establish cardiac care she did tell me that she had with recent significant fatigue.At the conclusion her visit I added low-dose Entresto,ordered an MRI to investigate her nonischemic cardiomyopathy, as well as get the patient set up for LifeVest as I was concerned for her low threshold for ventricular arrhythmia given her lightheadedness.  On July 23, 2019 she had MRI which showed a ejection fraction of 60% with no  specific late enhancement gadolinium that suggest any type of specific processes.  I last saw her on August 21, 2019 at that time we added Farxiga 10 mg to her medication regimen.  Tells me that she has been doing well on this medication she has not had any side effects.  I saw the patient on October 19, 2019 at that time we discussed her repeat echocardiogram.  With medication adjustment.  Interim the patient has seen Dr. Gala Jenkins for heart failure treatment.  She also has been following with our pharmacy clinic for the up titration of her medications.  She tells me that recently she was experienced diarrhea which caused some transient hypotension.  She held her [name] and Coreg for couple days.  She is feeling a lot better now.  She started back on her Coreg and Entresto today.  She tells me that she is doing well other than the diarrhea.  She is able to tolerate her exercises.  She is doing cardiac rehab and seems to be doing well with this.  Past Medical History:  Diagnosis Date  . ACC/AHA stage C systolic heart failure (HCC) 08/21/2019  . Acute systolic CHF (congestive heart failure) (HCC) 06/10/2019  . Asthma 05/16/2018  . Cervical radiculopathy 06/27/2018  . Chronic back pain 05/16/2018  . Degeneration of lumbar intervertebral disc 06/27/2018  . Depressed left ventricular ejection fraction   . Heart failure with reduced ejection fraction (HCC)   . Hyperlipidemia   . Lumbar radiculopathy 06/28/2018  . Mixed hyperlipidemia 08/21/2019  . Neck pain 05/16/2018  . Nonischemic cardiomyopathy (HCC)   . NYHA  class 3 heart failure with reduced ejection fraction (HCC) 08/21/2019  . Scoliosis deformity of spine 07/17/2018    Past Surgical History:  Procedure Laterality Date  . CESAREAN SECTION    . RIGHT/LEFT HEART CATH AND CORONARY ANGIOGRAPHY N/A 06/11/2019   Procedure: RIGHT/LEFT HEART CATH AND CORONARY ANGIOGRAPHY;  Surgeon: Krista Crafts, MD;  Location: [facility] INVASIVE CV LAB;   Service: Cardiovascular;  Laterality: N/A;    Current Medications: Current Meds  Medication Sig  . amitriptyline (ELAVIL) 25 MG tablet Take 50 mg by mouth at bedtime.  Marland Kitchen atorvastatin (LIPITOR) 40 MG tablet Take 1 tablet (40 mg total) by mouth daily at 6 PM.  . carvedilol (COREG) 3.125 MG tablet Take 1 tablet (3.125 mg total) by mouth 2 (two) times daily with a meal.  . citalopram (CELEXA) 10 MG tablet Take 10 mg by mouth daily.  . dapagliflozin propanediol (FARXIGA) 10 MG TABS tablet Take 10 mg by mouth daily before breakfast.  . digoxin (LANOXIN) 0.125 MG tablet Take 1 tablet (0.125 mg total) by mouth daily.  . diphenhydramine-acetaminophen (TYLENOL PM) 25-500 MG TABS tablet Take 1 tablet by mouth at bedtime as needed (for pain).  . sacubitril-valsartan (ENTRESTO) 97-103 MG Take 1 tablet by mouth 2 (two) times daily.  Marland Kitchen spironolactone (ALDACTONE) 25 MG tablet Take 1 tablet (25 mg total) by mouth daily.     Allergies:   Latex   Social History   Socioeconomic History  . Marital status: Unknown    Spouse name: Not on file  . Number of children: Not on file  . Years of education: Not on file  . Highest education level: Not on file  Occupational History  . Not on file  Tobacco Use  . Smoking status: Former Games developer  . Smokeless tobacco: Never Used  Substance and Sexual Activity  . Alcohol use: Not Currently  . Drug use: Not on file  . Sexual activity: Not on file  Other Topics Concern  . Not on file  Social History Narrative  . Not on file   Social Determinants of Health   Financial Resource Strain:   . Difficulty of Paying Living Expenses:   Food Insecurity:   . Worried About Programme researcher, broadcasting/film/video in the Last Year:   . Barista in the Last Year:   Transportation Needs:   . Freight forwarder (Medical):   Marland Kitchen Lack of Transportation (Non-Medical):   Physical Activity:   . Days of Exercise per Week:   . Minutes of Exercise per Session:   Stress:   . Feeling of  Stress :   Social Connections:   . Frequency of Communication with Friends and Family:   . Frequency of Social Gatherings with Friends and Family:   . Attends Religious Services:   . Active Member of Clubs or Organizations:   . Attends Banker Meetings:   Marland Kitchen Marital Status:      Family History: The patient's family history includes CAD in her father; Hypertension in her brother.  ROS:   Review of Systems  Constitution: Negative for decreased appetite, fever and weight gain.  HENT: Negative for congestion, ear discharge, hoarse voice and sore throat.   Eyes: Negative for discharge, redness, vision loss in right eye and visual halos.  Cardiovascular: Negative for chest pain, dyspnea on exertion, leg swelling, orthopnea and palpitations.  Respiratory: Negative for cough, hemoptysis, shortness of breath and snoring.   Endocrine: Negative for heat intolerance and polyphagia.  Hematologic/Lymphatic: Negative for bleeding problem. Does not bruise/bleed easily.  Skin: Negative for flushing, nail changes, rash and suspicious lesions.  Musculoskeletal: Negative for arthritis, joint pain, muscle cramps, myalgias, neck pain and stiffness.  Gastrointestinal: Negative for abdominal pain, bowel incontinence, diarrhea and excessive appetite.  Genitourinary: Negative for decreased libido, genital sores and incomplete emptying.  Neurological: Negative for brief paralysis, focal weakness, headaches and loss of balance.  Psychiatric/Behavioral: Negative for altered mental status, depression and suicidal ideas.  Allergic/Immunologic: Negative for HIV exposure and persistent infections.    EKGs/Labs/Other Studies Reviewed:    The following studies were reviewed today:   EKG:  The ekg ordered today demonstrates   Recent Labs: 06/10/2019: ALT 42 10/13/2019: Magnesium 2.3 10/20/2019: TSH 0.702 11/11/2019: Hemoglobin 14.9; Platelets 235 12/16/2019: B Natriuretic Peptide 57.4; BUN 15;  Creatinine, Ser 0.74; Potassium 4.4; Sodium 140  Recent Lipid Panel No results found for: CHOL, TRIG, HDL, CHOLHDL, VLDL, LDLCALC, LDLDIRECT  Physical Exam:    VS:  BP 112/76 (BP Location: Left Arm, Patient Position: Sitting, Cuff Size: Normal)   Pulse 97   Ht 5\' 6"  (1.676 m)   Wt 181 lb 6.4 oz (82.3 kg)   LMP  (LMP Unknown)   SpO2 96%   BMI 29.28 kg/m     Wt Readings from Last 3 Encounters:  01/04/20 181 lb 6.4 oz (82.3 kg)  12/16/19 187 lb (84.8 kg)  11/17/19 188 lb 3.2 oz (85.4 kg)     GEN: Well nourished, well developed in no acute distress HEENT: Normal NECK: No JVD; No carotid bruits LYMPHATICS: No lymphadenopathy CARDIAC: S1S2 noted,RRR, no murmurs, rubs, gallops RESPIRATORY:  Clear to auscultation without rales, wheezing or rhonchi  ABDOMEN: Soft, non-tender, non-distended, +bowel sounds, no guarding. EXTREMITIES: No edema, No cyanosis, no clubbing MUSCULOSKELETAL:  No deformity  SKIN: Warm and dry NEUROLOGIC:  Alert and oriented x 3, non-focal PSYCHIATRIC:  Normal affect, good insight  ASSESSMENT:    1. Nonischemic cardiomyopathy (HCC)   2. Heart failure with reduced ejection fraction (HCC)   3. NYHA class 3 heart failure with reduced ejection fraction (HCC)   4. Mixed hyperlipidemia    PLAN:     She continues to improve physically with improved physical stamina.  In terms of her nonischemic cardiomyopathy we continue to titrate her medication.  She is on Farxiga 10 mg daily, digoxin 0.25 mg daily, Entresto 97-103 mg twice daily, Aldactone 25 mg daily.  She is on carvedilol 3.125 mg twice a day I will like to increase this to 6.25 mg twice daily but I am going to wait giving her diarrhea and low blood pressure.  Once she is recovered from her diarrhea we will increase her carvedilol to 6.25 mg twice daily.  Blood work will be done today to assess her electrolytes.  She still is wearing her LifeVest and she does not have any reports of device being  discharged.  She has a repeat echo on January 21, 2020, with a follow-up with our heart failure clinic at the same time.  The patient is in agreement with the above plan. The patient left the office in stable condition.  The patient will follow up in 3 months or sooner if needed.   Medication Adjustments/Labs and Tests Ordered: Current medicines are reviewed at length with the patient today.  Concerns regarding medicines are outlined above.  Orders Placed This Encounter  Procedures  . Basic metabolic panel  . Magnesium   No orders of the defined types were  placed in this encounter.   Patient Instructions  Medication Instructions:  No medication changes. *If you need a refill on your cardiac medications before your next appointment, please call your pharmacy*   Lab Work: Your physician recommends that you have a BMET and Magnesium today in the office.  If you have labs (blood work) drawn today and your tests are completely normal, you will receive your results only by: Marland Kitchen MyChart Message (if you have MyChart) OR . A paper copy in the mail If you have any lab test that is abnormal or we need to change your treatment, we will call you to review the results.   Testing/Procedures: None ordered   Follow-Up: At [facility], you and your health needs are our priority.  As Jenkins of our continuing mission to provide you with exceptional heart care, we have created designated Provider Care Teams.  These Care Teams include your primary Cardiologist (physician) and Advanced Practice Providers (APPs -  Physician Assistants and Nurse Practitioners) who all work together to provide you with the care you need, when you need it.  We recommend signing up for the patient portal called "MyChart".  Sign up information is provided on this After Visit Summary.  MyChart is used to connect with patients for Virtual Visits (Telemedicine).  Patients are able to view lab/test results, encounter notes,  upcoming appointments, etc.  Non-urgent messages can be sent to your provider as well.   To learn more about what you can do with MyChart, go to [email or web address].    Your next appointment:   3 month(s)  The format for your next appointment:   In Person  Provider:   Thomasene Ripple, DO   Other Instructions NA     Adopting a Healthy Lifestyle.  Know what a healthy weight is for you (roughly BMI <25) and aim to maintain this   Aim for 7+ servings of fruits and vegetables daily   65-80+ fluid ounces of water or unsweet tea for healthy kidneys   Limit to max 1 drink of alcohol per day; avoid smoking/tobacco   Limit animal fats in diet for cholesterol and heart health - choose grass fed whenever available   Avoid highly processed foods, and foods high in saturated/trans fats   Aim for low stress - take time to unwind and care for your mental health   Aim for 150 min of moderate intensity exercise weekly for heart health, and weights twice weekly for bone health   Aim for 7-9 hours of sleep daily   When it comes to diets, agreement about the perfect plan isnt easy to find, even among the experts. Experts at the [facility] of [employer] developed an idea known as the Healthy Eating Plate. Just imagine a plate divided into logical, healthy portions.   The emphasis is on diet quality:   Load up on vegetables and fruits - one-half of your plate: Aim for color and variety, and remember that potatoes dont count.   Go for whole grains - one-quarter of your plate: Whole wheat, barley, wheat berries, quinoa, oats, brown rice, and foods made with them. If you want pasta, go with whole wheat pasta.   Protein power - one-quarter of your plate: Fish, chicken, beans, and nuts are all healthy, versatile protein sources. Limit red meat.   The diet, however, does go beyond the plate, offering a few other suggestions.   Use healthy plant oils, such as olive, canola, soy, corn,  sunflower and peanut.  Check the labels, and avoid partially hydrogenated oil, which have unhealthy trans fats.   If youre thirsty, drink water. Coffee and tea are good in moderation, but skip sugary drinks and limit milk and dairy products to one or two daily servings.   The type of carbohydrate in the diet is more important than the amount. Some sources of carbohydrates, such as vegetables, fruits, whole grains, and beans-are healthier than others.   Finally, stay active  Signed, Krista Ripple, DO  01/04/2020 2:33 PM    [facility]

## 2020-01-05 LAB — BASIC METABOLIC PANEL
BUN/Creatinine Ratio: 16 (ref 9–23)
BUN: 13 mg/dL (ref 6–24)
CO2: 24 mmol/L (ref 20–29)
Calcium: 9.3 mg/dL (ref 8.7–10.2)
Chloride: 103 mmol/L (ref 96–106)
Creatinine, Ser: 0.81 mg/dL (ref 0.57–1.00)
GFR calc Af Amer: 93 mL/min/{1.73_m2} (ref >59)
GFR calc non Af Amer: 80 mL/min/{1.73_m2} (ref >59)
Glucose: 97 mg/dL (ref 65–99)
Potassium: 4.7 mmol/L (ref 3.5–5.2)
Sodium: 140 mmol/L (ref 134–144)

## 2020-01-05 LAB — MAGNESIUM: Magnesium: 2 mg/dL (ref 1.6–2.3)

## 2020-01-21 ENCOUNTER — Ambulatory Visit (HOSPITAL_BASED_OUTPATIENT_CLINIC_OR_DEPARTMENT_OTHER)
Admission: RE | Admit: 2020-01-21 | Discharge: 2020-01-21 | Disposition: A | Discharge status: Home / Self Care | Payer: Self-pay | Source: Ambulatory Visit | Attending: Internal Medicine | Admitting: Internal Medicine

## 2020-01-21 ENCOUNTER — Other Ambulatory Visit: Payer: Self-pay

## 2020-01-21 ENCOUNTER — Ambulatory Visit (HOSPITAL_COMMUNITY)
Admission: RE | Admit: 2020-01-21 | Discharge: 2020-01-21 | Disposition: A | Discharge status: Home / Self Care | Payer: Self-pay | Source: Ambulatory Visit | Attending: Internal Medicine | Admitting: Internal Medicine

## 2020-01-21 VITALS — BP 105/70 | HR 90 | Wt 180.0 lb

## 2020-01-21 DIAGNOSIS — Z8249 Family history of ischemic heart disease and other diseases of the circulatory system: Secondary | ICD-10-CM | POA: Insufficient documentation

## 2020-01-21 DIAGNOSIS — Z7984 Long term (current) use of oral hypoglycemic drugs: Secondary | ICD-10-CM | POA: Insufficient documentation

## 2020-01-21 DIAGNOSIS — I428 Other cardiomyopathies: Secondary | ICD-10-CM | POA: Insufficient documentation

## 2020-01-21 DIAGNOSIS — R55 Syncope and collapse: Secondary | ICD-10-CM | POA: Insufficient documentation

## 2020-01-21 DIAGNOSIS — Z79899 Other long term (current) drug therapy: Secondary | ICD-10-CM | POA: Insufficient documentation

## 2020-01-21 DIAGNOSIS — I5022 Chronic systolic (congestive) heart failure: Secondary | ICD-10-CM

## 2020-01-21 DIAGNOSIS — Z87891 Personal history of nicotine dependence: Secondary | ICD-10-CM | POA: Insufficient documentation

## 2020-01-21 DIAGNOSIS — E782 Mixed hyperlipidemia: Secondary | ICD-10-CM | POA: Insufficient documentation

## 2020-01-21 DIAGNOSIS — F329 Major depressive disorder, single episode, unspecified: Secondary | ICD-10-CM | POA: Insufficient documentation

## 2020-01-21 DIAGNOSIS — M549 Dorsalgia, unspecified: Secondary | ICD-10-CM | POA: Insufficient documentation

## 2020-01-21 DIAGNOSIS — G8929 Other chronic pain: Secondary | ICD-10-CM | POA: Insufficient documentation

## 2020-01-21 LAB — ECHOCARDIOGRAM COMPLETE
AR max vel: 2.75 cm2
AV Area VTI: 2.43 cm2
AV Area mean vel: 2.64 cm2
AV Mean grad: 4 mmHg
AV Peak grad: 6.9 mmHg
Ao pk vel: 1.31 m/s
Calc EF: 34.4 %
S' Lateral: 5 cm
Single Plane A2C EF: 34.3 %
Single Plane A4C EF: 35.2 %

## 2020-01-21 LAB — BASIC METABOLIC PANEL
Anion gap: 8 (ref 5–15)
BUN: 12 mg/dL (ref 6–20)
CO2: 28 mmol/L (ref 22–32)
Calcium: 9.4 mg/dL (ref 8.9–10.3)
Chloride: 103 mmol/L (ref 98–111)
Creatinine, Ser: 0.75 mg/dL (ref 0.44–1.00)
GFR calc Af Amer: >60 mL/min (ref >60)
GFR calc non Af Amer: >60 mL/min (ref >60)
Glucose, Bld: 120 mg/dL — ABNORMAL HIGH (ref 70–99)
Potassium: 3.8 mmol/L (ref 3.5–5.1)
Sodium: 139 mmol/L (ref 135–145)

## 2020-01-21 LAB — BRAIN NATRIURETIC PEPTIDE: B Natriuretic Peptide: 37.3 pg/mL (ref 0.0–100.0)

## 2020-01-21 MED ORDER — CARVEDILOL 6.25 MG PO TABS: ORAL_TABLET | ORAL | 6 refills | Status: DC

## 2020-01-21 NOTE — Progress Notes (Signed)
  Echocardiogram 2D Echocardiogram has been performed.  Gerda Diss 01/21/2020, 2:55 PM

## 2020-01-21 NOTE — Patient Instructions (Addendum)
Increase Carvedilol 6.25mg  (1 tab) Twice daily FOR 2 WEEKS  After that (on 8/12) Increase Carvedilol to 9.375mg  (1 1/2 tabs) Twice daily   Labs done today, your results will be available in MyChart, we will contact you for abnormal readings.   If you have any questions or concerns before your next appointment please send [place] a message through [place] or call our office at [phone].    TO LEAVE A MESSAGE FOR THE NURSE SELECT OPTION 2, PLEASE LEAVE A MESSAGE INCLUDING: . YOUR NAME . DATE OF BIRTH . CALL BACK NUMBER . REASON FOR CALL**this is important as we prioritize the call backs  YOU WILL RECEIVE A CALL BACK THE SAME DAY AS LONG AS YOU CALL BEFORE 4:00 PM  At the [facility], you and your health needs are our priority. As part of our continuing mission to provide you with exceptional heart care, we have created designated Provider Care Teams. These Care Teams include your primary Cardiologist (physician) and Advanced Practice Providers (APPs- Physician Assistants and Nurse Practitioners) who all work together to provide you with the care you need, when you need it.   You may see any of the following providers on your designated Care Team at your next follow up: Marland Kitchen Dr Arvilla Meres . Dr Marca Ancona . Tonye Becket, NP . Robbie Lis, PA . Karle Plumber, PharmD   Please be sure to bring in all your medications bottles to every appointment.

## 2020-01-21 NOTE — Progress Notes (Signed)
ADVANCED HF CLINIC NOTE  Referring Physician: Dr. [name] Primary Care: Dr. Luna Kitchens [facility])  Primary Cardiologist: Dr. [name]  HPI:  Althia Egolf a 59 y.o.femalewith a HL, chronic back pain and systolic HF due to NICM referred by Dr. [name] for further evaluation of her HF.  In November was coughing and went to PCP with cough and SOB. Diagnosed with bronchitis/PNA. No f/c. Was sob with bad cough. Presented to [facility] in 12/20 with new onset HF. EF 20-25% on echo. Transferred to [facility] for cath. Cath with EF < 25% and normal cors. Pulmonary capillary wedge pressure was 15 mmHg and a mean pulmonary pressure was 18 mmHg,cardiac output 4.9 L with a cardiac index of 2.5.   Underwent cardiac MRI on July 23, 2019 which showed a ejection fraction 16% with no specific late enhancement gadolinium that suggest any type of specific processes. Mild MR. Normal RV  Repeat echo 10/01/19 EF < 20% normal RV.   No h/o HTN. No FHx of HF.    Here with her husband. Has been following with our PharmD and meds titrated. Feeling much better. Has finished CR and now going to maintenance program. Denies SOB, orthopnea or PND with regular activities. SOB if goes up hill. BP ok. Wearing LifeVest  Echo today 01/21/20 EF 25% RV normal     Past Medical History:  Diagnosis Date   ACC/AHA stage C systolic heart failure (HCC) 08/21/2019   Acute systolic CHF (congestive heart failure) (HCC) 06/10/2019   Asthma 05/16/2018   Cervical radiculopathy 06/27/2018   Chronic back pain 05/16/2018   Degeneration of lumbar intervertebral disc 06/27/2018   Depressed left ventricular ejection fraction    Heart failure with reduced ejection fraction (HCC)    Hyperlipidemia    Lumbar radiculopathy 06/28/2018   Mixed hyperlipidemia 08/21/2019   Neck pain 05/16/2018   Nonischemic cardiomyopathy (HCC)    NYHA class 3 heart failure with reduced ejection fraction (HCC) 08/21/2019   Scoliosis deformity  of spine 07/17/2018    Current Outpatient Medications  Medication Sig Dispense Refill   amitriptyline (ELAVIL) 25 MG tablet Take 50 mg by mouth at bedtime.     atorvastatin (LIPITOR) 40 MG tablet Take 1 tablet (40 mg total) by mouth daily at 6 PM. 90 tablet 3   carvedilol (COREG) 3.125 MG tablet Take 1 tablet (3.125 mg total) by mouth 2 (two) times daily with a meal. 180 tablet 3   citalopram (CELEXA) 10 MG tablet Take 10 mg by mouth daily.     dapagliflozin propanediol (FARXIGA) 10 MG TABS tablet Take 10 mg by mouth daily before breakfast. 30 tablet 5   digoxin (LANOXIN) 0.125 MG tablet Take 1 tablet (0.125 mg total) by mouth daily. 30 tablet 2   diphenhydramine-acetaminophen (TYLENOL PM) 25-500 MG TABS tablet Take 1 tablet by mouth at bedtime as needed (for pain).     sacubitril-valsartan (ENTRESTO) 97-103 MG Take 1 tablet by mouth 2 (two) times daily. 180 tablet 3   spironolactone (ALDACTONE) 25 MG tablet Take 1 tablet (25 mg total) by mouth daily. 90 tablet 1   No current facility-administered medications for this encounter.    Allergies  Allergen Reactions   Latex       Social History   Socioeconomic History   Marital status: Unknown    Spouse name: Not on file   Number of children: Not on file   Years of education: Not on file   Highest education level: Not on  file  Occupational History   Not on file  Tobacco Use   Smoking status: Former Smoker   Smokeless tobacco: Never Used  Substance and Sexual Activity   Alcohol use: Not Currently   Drug use: Not on file   Sexual activity: Not on file  Other Topics Concern   Not on file  Social History Narrative   Not on file   Social Determinants of Health   Financial Resource Strain:    Difficulty of Paying Living Expenses:   Food Insecurity:    Worried About Programme researcher, broadcasting/film/video in the Last Year:    Barista in the Last Year:   Transportation Needs:    Freight forwarder (Medical):     Lack of Transportation (Non-Medical):   Physical Activity:    Days of Exercise per Week:    Minutes of Exercise per Session:   Stress:    Feeling of Stress :   Social Connections:    Frequency of Communication with Friends and Family:    Frequency of Social Gatherings with Friends and Family:    Attends Religious Services:    Active Member of Clubs or Organizations:    Attends Engineer, structural:    Marital Status:   Intimate Partner Violence:    Fear of Current or Ex-Partner:    Emotionally Abused:    Physically Abused:    Sexually Abused:       Family History  Problem Relation Age of Onset   CAD Father    Hypertension Brother     Vitals:   01/21/20 1515  BP: 105/70  Pulse: 90  SpO2: 98%  Weight: 81.6 kg (180 lb)    PHYSICAL EXAM: General:  Well appearing. No resp difficulty HEENT: normal Neck: supple. no JVD. Carotids 2+ bilat; no bruits. No lymphadenopathy or thryomegaly appreciated. Cor: PMI nondisplaced. Regular rate & rhythm. No rubs, gallops or murmurs. Lungs: clear Abdomen: soft, nontender, nondistended. No hepatosplenomegaly. No bruits or masses. Good bowel sounds. Extremities: no cyanosis, clubbing, rash, edema Neuro: alert & orientedx3, cranial nerves grossly intact. moves all 4 extremities w/o difficulty. Affect pleasant  ASSESSMENT & PLAN:  1. 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 EF < 20% - cMRI 1/21 LVEF 16% with no LGE Mild MR. Normal RV - Echo 10/01/19 EF < 20% normal RV.  - Echo 01/21/20 EF 25% RV normal Personally reviewed - Symptomatically improved NYHA II. Volume status looks good - Continue Entresto to 97/103 bid - Increase carvedilol to 6.25 bid for 2 weeks then 9.375 bid - Continue Farxiga 10 - Continue digoxin 0.125 - Continue spiro 25  - Etiology of NICM unclear. EF improving slowly.  - Continue LifeVest.  - Overall improved. On excellent GDMT. EF improving slowly. Have decided  to continue LifeVest and hold off on ICD for now hopeful that EF will continue to recover.  - Labs today. See back in 1 month.  - Echo in 3 months.  2. Pre-syncopal episode - at high risk for ventricular arrhythmias but no events on Lifevest - continue Lifevest - check labs - plan otherwise as above  Arvilla Meres, MD  3:43 PM

## 2020-01-21 NOTE — Addendum Note (Signed)
Encounter addended by: Samara Snide, RN on: 01/21/2020 4:14 PM  Actions taken: Pharmacy for encounter modified, Order list changed, Diagnosis association updated, Charge Capture section accepted, Clinical Note Signed

## 2020-01-27 ENCOUNTER — Other Ambulatory Visit (HOSPITAL_COMMUNITY): Payer: Self-pay | Admitting: Internal Medicine

## 2020-02-23 ENCOUNTER — Ambulatory Visit (HOSPITAL_COMMUNITY)
Admission: RE | Admit: 2020-02-23 | Discharge: 2020-02-23 | Disposition: A | Discharge status: Home / Self Care | Payer: Self-pay | Source: Ambulatory Visit | Attending: Cardiology | Admitting: Cardiology

## 2020-02-23 ENCOUNTER — Other Ambulatory Visit: Payer: Self-pay

## 2020-02-23 ENCOUNTER — Encounter (HOSPITAL_COMMUNITY): Payer: Self-pay

## 2020-02-23 VITALS — BP 126/92 | HR 94 | Temp 97.6°F | Ht 66.0 in | Wt 179.0 lb

## 2020-02-23 DIAGNOSIS — Z7984 Long term (current) use of oral hypoglycemic drugs: Secondary | ICD-10-CM | POA: Insufficient documentation

## 2020-02-23 DIAGNOSIS — I428 Other cardiomyopathies: Secondary | ICD-10-CM | POA: Insufficient documentation

## 2020-02-23 DIAGNOSIS — E782 Mixed hyperlipidemia: Secondary | ICD-10-CM | POA: Insufficient documentation

## 2020-02-23 DIAGNOSIS — Z87891 Personal history of nicotine dependence: Secondary | ICD-10-CM | POA: Insufficient documentation

## 2020-02-23 DIAGNOSIS — J45909 Unspecified asthma, uncomplicated: Secondary | ICD-10-CM | POA: Insufficient documentation

## 2020-02-23 DIAGNOSIS — Z8249 Family history of ischemic heart disease and other diseases of the circulatory system: Secondary | ICD-10-CM | POA: Insufficient documentation

## 2020-02-23 DIAGNOSIS — Z7901 Long term (current) use of anticoagulants: Secondary | ICD-10-CM | POA: Insufficient documentation

## 2020-02-23 DIAGNOSIS — F329 Major depressive disorder, single episode, unspecified: Secondary | ICD-10-CM | POA: Insufficient documentation

## 2020-02-23 DIAGNOSIS — G8929 Other chronic pain: Secondary | ICD-10-CM | POA: Insufficient documentation

## 2020-02-23 DIAGNOSIS — I5022 Chronic systolic (congestive) heart failure: Secondary | ICD-10-CM | POA: Insufficient documentation

## 2020-02-23 DIAGNOSIS — Z79899 Other long term (current) drug therapy: Secondary | ICD-10-CM | POA: Insufficient documentation

## 2020-02-23 DIAGNOSIS — I5020 Unspecified systolic (congestive) heart failure: Secondary | ICD-10-CM

## 2020-02-23 LAB — BASIC METABOLIC PANEL
Anion gap: 10 (ref 5–15)
BUN: 12 mg/dL (ref 6–20)
CO2: 24 mmol/L (ref 22–32)
Calcium: 9.3 mg/dL (ref 8.9–10.3)
Chloride: 103 mmol/L (ref 98–111)
Creatinine, Ser: 0.74 mg/dL (ref 0.44–1.00)
GFR calc Af Amer: >60 mL/min (ref >60)
GFR calc non Af Amer: >60 mL/min (ref >60)
Glucose, Bld: 112 mg/dL — ABNORMAL HIGH (ref 70–99)
Potassium: 4 mmol/L (ref 3.5–5.1)
Sodium: 137 mmol/L (ref 135–145)

## 2020-02-23 LAB — DIGOXIN LEVEL: Digoxin Level: 0.9 ng/mL (ref 0.8–2.0)

## 2020-02-23 LAB — BRAIN NATRIURETIC PEPTIDE: B Natriuretic Peptide: 33.8 pg/mL (ref 0.0–100.0)

## 2020-02-23 MED ORDER — CARVEDILOL 12.5 MG PO TABS: 12.5000 mg | ORAL_TABLET | Freq: Two times a day (BID) | ORAL | 6 refills | Status: DC

## 2020-02-23 NOTE — Progress Notes (Cosign Needed)
ADVANCED HF CLINIC NOTE  Referring Physician: Dr. [name] Primary Care: Dr. Luna Kitchens [facility])  Primary Cardiologist: Dr. [name] [facility]: Dr. Gala Romney   Reason for Visit: f/u for chronic systolic heart failure, med titration   HPI:  Krista Jenkins a 59 y.o.femalewith a HL, chronic back pain and systolic HF due to NICM referred by Dr. [name] for further evaluation of her HF.  In November was coughing and went to PCP with cough and SOB. Diagnosed with bronchitis/PNA. No f/c. Was sob with bad cough. Presented to [facility] in 12/20 with new onset HF. EF 20-25% on echo. Transferred to [facility] for cath. Cath with EF < 25% and normal cors. Pulmonary capillary wedge pressure was 15 mmHg and a mean pulmonary pressure was 18 mmHg,cardiac output 4.9 L with a cardiac index of 2.5.   Underwent cardiac MRI on July 23, 2019 which showed a ejection fraction 16% with no specific late enhancement gadolinium that suggest any type of specific processes. Mild MR. Normal RV   Repeat echo 10/01/19 EF < 20% normal RV.   No h/o HTN. No FHx of HF.   Echo repeated 01/21/20, EF slightly improved 25% RV normal.  She presents to clinic today for f/u and for further med titration. Here w/ her husband. Reports full compliance w/ meds. Tolerating well. BP stable. No side effects. Wt is stable, 179 lb c/w last OV wt. Remains NYHA Class II. Continues w/ cardiac rehab. Fully compliant w/ LifeVest. No VT. No shocks. Her only complaint is recent productive cough.  She was seen by PCP and she tells me that she was diagnosed w/ bronchitis and was prescribed short course of prednisone. No fever or chills. Her cough has improved. No wt gain.     Past Medical History:  Diagnosis Date  . ACC/AHA stage C systolic heart failure (HCC) 08/21/2019  . Acute systolic CHF (congestive heart failure) (HCC) 06/10/2019  . Asthma 05/16/2018  . Cervical radiculopathy 06/27/2018  . Chronic back pain 05/16/2018  . Degeneration of  lumbar intervertebral disc 06/27/2018  . Depressed left ventricular ejection fraction   . Heart failure with reduced ejection fraction (HCC)   . Hyperlipidemia   . Lumbar radiculopathy 06/28/2018  . Mixed hyperlipidemia 08/21/2019  . Neck pain 05/16/2018  . Nonischemic cardiomyopathy (HCC)   . 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 Scoliosis deformity of spine 07/17/2018    Current Outpatient Medications  Medication Sig Dispense Refill  . amitriptyline (ELAVIL) 25 MG tablet Take 50 mg by mouth at bedtime.    Marland Kitchen atorvastatin (LIPITOR) 40 MG tablet Take 1 tablet (40 mg total) by mouth daily at 6 PM. 90 tablet 3  . carvedilol (COREG) 6.25 MG tablet Take 1 tablet (6.25 mg total) by mouth 2 (two) times daily with a meal for 14 days, THEN 1.5 tablets (9.375 mg total) 2 (two) times daily with a meal. 90 tablet 6  . citalopram (CELEXA) 10 MG tablet Take 10 mg by mouth daily.    . dapagliflozin propanediol (FARXIGA) 10 MG TABS tablet Take 10 mg by mouth daily before breakfast. 30 tablet 5  . digoxin (LANOXIN) 0.125 MG tablet TAKE 1 TABLET BY MOUTH DAILY 30 tablet 3  . diphenhydramine-acetaminophen (TYLENOL PM) 25-500 MG TABS tablet Take 1 tablet by mouth at bedtime as needed (for pain).    . predniSONE (STERAPRED UNI-PAK 21 TAB) 5 MG (21) TBPK tablet Take by mouth as directed.    Marland Kitchen  sacubitril-valsartan (ENTRESTO) 97-103 MG Take 1 tablet by mouth 2 (two) times daily. 180 tablet 3  . spironolactone (ALDACTONE) 25 MG tablet Take 1 tablet (25 mg total) by mouth daily. 90 tablet 1   No current facility-administered medications for this encounter.    Allergies  Allergen Reactions  . Latex       Social History   Socioeconomic History  . Marital status: Unknown    Spouse name: Not on file  . Number of children: Not on file  . Years of education: Not on file  . Highest education level: Not on file  Occupational History  . Not on file  Tobacco Use  . Smoking  status: Former Games developer  . Smokeless tobacco: Never Used  Substance and Sexual Activity  . Alcohol use: Not Currently  . Drug use: Not on file  . Sexual activity: Not on file  Other Topics Concern  . Not on file  Social History Narrative  . Not on file   Social Determinants of Health   Financial Resource Strain:   . Difficulty of Paying Living Expenses: Not on file  Food Insecurity:   . Worried About Programme researcher, broadcasting/film/video in the Last Year: Not on file  . Ran Out of Food in the Last Year: Not on file  Transportation Needs:   . Lack of Transportation (Medical): Not on file  . Lack of Transportation (Non-Medical): Not on file  Physical Activity:   . Days of Exercise per Week: Not on file  . Minutes of Exercise per Session: Not on file  Stress:   . Feeling of Stress : Not on file  Social Connections:   . Frequency of Communication with Friends and Family: Not on file  . Frequency of Social Gatherings with Friends and Family: Not on file  . Attends Religious Services: Not on file  . Active Member of Clubs or Organizations: Not on file  . Attends Banker Meetings: Not on file  . Marital Status: Not on file  Intimate Partner Violence:   . Fear of Current or Ex-Partner: Not on file  . Emotionally Abused: Not on file  . Physically Abused: Not on file  . Sexually Abused: Not on file      Family History  Problem Relation Age of Onset  . CAD Father   . Hypertension Brother     Vitals:   02/23/20 0829  BP: (!) 126/92  Pulse: 94  SpO2: 98%  Weight: 81.2 kg (179 lb)  Height: 5\' 6"  (1.676 m)    PHYSICAL EXAM: General:  Well appearing. No resp difficulty HEENT: normal Neck: supple. no JVD. Carotids 2+ bilat; no bruits. No lymphadenopathy or thryomegaly appreciated. Cor: PMI nondisplaced. Regular rate & rhythm. No rubs, gallops or murmurs. +LifeVest  Lungs: clear Abdomen: soft, nontender, nondistended. No hepatosplenomegaly. No bruits or masses. Good bowel  sounds. Extremities: no cyanosis, clubbing, rash, edema Neuro: alert & orientedx3, cranial nerves grossly intact. moves all 4 extremities w/o difficulty. Affect pleasant  ASSESSMENT & PLAN:  1. 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 EF < 20% - cMRI 1/21 LVEF 16% with no LGE Mild MR. Normal RV - Echo 10/01/19 EF < 20% normal RV.  - Echo 01/21/20 EF 25%, RV normal  - euvolemic on exam. Wt stable - NYHA Class II, BP 126/92. HR 94 bpm  - Continue Entresto to 97/103 bid - Increase carvedilol to 12.5 mg bid - Continue Farxiga 10 mg -  Continue digoxin 0.125. Check dig level today  - Continue spiro 25 mg  - Etiology of NICM unclear. EF improving slowly.  - Continue LifeVest (no VT/ no shocks on interrogation report)  - Overall improved. On excellent GDMT. EF improving slowly. Have decided to continue LifeVest and hold off on ICD for now, hopeful that EF will continue to recover.  - check BMP today  - f/u again w/ pharmD in 2-3 weeks, can further increase Coreg to 25 mg BID at next visit if BP allows - consider additional of Corlanor if unable to achieve HR <70 despite titration of  blocker - Plan to repeat echo in 3 months to reassess LVEF  2. ? Bronchitis - she endorses productive cough, prescribed short course of prednisone by PCP w/ symptomatic improvement thus far - wt stable and doubt cough 2/2 CHF but will check BNP to better gauge fluid status - if BNP elevated, can add low dose PRN Lasix    F/u in 2-3 weeks   Robbie Lis, PA-C  8:37 AM

## 2020-02-23 NOTE — Patient Instructions (Signed)
INCREASE Coreg to 12.5 mg, one tab twice a day  Labs today We will only contact you if something comes back abnormal or we need to make some changes. Otherwise no news is good news!  You have been referred to [facility] Nutrition and Dietician  Address: [address], [place], [place] [address] Phone: [phone] -they will be in contact for an appointment  Your physician recommends that you schedule a follow-up appointment in: 3-4 weeks with the pharmacy team  Do the following things EVERYDAY: 1) Weigh yourself in the morning before breakfast. Write it down and keep it in a log. 2) Take your medicines as prescribed 3) Eat low salt foods--Limit salt (sodium) to 2000 mg per day.  4) Stay as active as you can everyday 5) Limit all fluids for the day to less than 2 liters  If you have any questions or concerns before your next appointment please send [place] a message through [place] or call our office at [phone].    TO LEAVE A MESSAGE FOR THE NURSE SELECT OPTION 2, PLEASE LEAVE A MESSAGE INCLUDING: . YOUR NAME . DATE OF BIRTH . CALL BACK NUMBER . REASON FOR CALL**this is important as we prioritize the call backs  YOU WILL RECEIVE A CALL BACK THE SAME DAY AS LONG AS YOU CALL BEFORE 4:00 PM

## 2020-02-26 ENCOUNTER — Telehealth (HOSPITAL_COMMUNITY): Payer: Self-pay

## 2020-02-26 MED ORDER — DIGOXIN 62.5 MCG PO TABS: 0.0625 mg | ORAL_TABLET | Freq: Every day | ORAL | 5 refills | Status: DC

## 2020-02-26 NOTE — Telephone Encounter (Signed)
-----   Message from Allayne Butcher, [place] sent at 02/23/2020  5:32 PM EDT ----- Dig level mildly elevated. Reduce dig to 0.0625 mg daily. Repeat dig level in a week

## 2020-02-26 NOTE — Telephone Encounter (Signed)
Pt advised of lab results and recommendations to start new dose of digoxin.  Advised to stop current dose and start new dose tomorrow as she took today's dose.  Pt will call us next week to provide lab as she is going out of town for 2 weeks.  Verbalized understanding.

## 2020-02-27 DIAGNOSIS — I509 Heart failure, unspecified: Secondary | ICD-10-CM

## 2020-02-27 DIAGNOSIS — R05 Cough: Secondary | ICD-10-CM

## 2020-02-27 DIAGNOSIS — I420 Dilated cardiomyopathy: Secondary | ICD-10-CM

## 2020-03-02 ENCOUNTER — Encounter (HOSPITAL_COMMUNITY): Payer: Self-pay

## 2020-03-02 ENCOUNTER — Other Ambulatory Visit (HOSPITAL_COMMUNITY): Payer: Self-pay | Admitting: Cardiology

## 2020-03-02 DIAGNOSIS — I5020 Unspecified systolic (congestive) heart failure: Secondary | ICD-10-CM

## 2020-03-02 NOTE — Progress Notes (Signed)
Orders for repeat labs placed and sent electronically to [facility]

## 2020-03-03 ENCOUNTER — Other Ambulatory Visit: Payer: Self-pay | Admitting: Cardiology

## 2020-03-03 ENCOUNTER — Encounter (HOSPITAL_COMMUNITY): Payer: Self-pay

## 2020-03-09 LAB — BASIC METABOLIC PANEL
BUN/Creatinine Ratio: 18 (ref 9–23)
BUN: 13 mg/dL (ref 6–24)
CO2: 28 mmol/L (ref 20–29)
Calcium: 9.1 mg/dL (ref 8.7–10.2)
Chloride: 102 mmol/L (ref 96–106)
Creatinine, Ser: 0.73 mg/dL (ref 0.57–1.00)
GFR calc Af Amer: 105 mL/min/{1.73_m2} (ref >59)
GFR calc non Af Amer: 91 mL/min/{1.73_m2} (ref >59)
Glucose: 91 mg/dL (ref 65–99)
Potassium: 4.2 mmol/L (ref 3.5–5.2)
Sodium: 140 mmol/L (ref 134–144)

## 2020-03-09 LAB — DIGOXIN LEVEL: Digoxin, Serum: <0.4 ng/mL — ABNORMAL LOW (ref 0.5–0.9)

## 2020-03-11 NOTE — Progress Notes (Signed)
Referring Physician: Dr. [name] Primary Care: Dr. Luna Kitchens [facility])  Primary Cardiologist: Dr. [name] Heart Failure: Dr. Gala Romney   HPI: Krista Jenkins a 59 y.o.femalewith a HL, chronic back pain and systolic HF due to NICM referred by Dr. [name] for further evaluation of her HF.  In November was coughing and went to PCP with cough and SOB. Diagnosed with bronchitis/PNA. No f/c. Was SOB with bad cough. Presented to [facility] in 12/20 with new onset HF. EF 20-25% on echo. Transferred to [facility] for cath. Cath with EF < 25% and normal cors. Pulmonary capillary wedge pressure was 15 mmHg and a mean pulmonary pressure was 18 mmHg,cardiac output 4.9 L with a cardiac index of 2.5.   Underwent cardiac MRI on January 28, 2021which showed a ejection fraction 16% with no specific late enhancement gadolinium that suggest any type of specific processes. Mild MR. Normal RV.   Repeat echo 10/01/19 EF < 20% normal RV.   No h/o HTN. No FHx of HF.   Echo repeated 01/21/20, EF slightly improved 25% RV normal.  She recently presented to HF Clinic for follow up with APP Clinic. Came with her husband. Reported full compliance with meds. BP was stable. No side effects. Weight was stable at 179 lbs. Remained NYHA Class II. Continued with cardiac rehab. Fully compliant with LifeVest. No VT. No shocks. Her only complaint was recent productive cough.  She was seen by PCP and stated that she was diagnosed with bronchitis and was prescribed short course of prednisone. No fever or chills. Her cough had improved. No weight gain.   Today she returns to HF clinic for pharmacist medication titration. At last visit with APP, carvedilol was increased to 12.5 mg BID and digoxin was decreased to 0.0625 mg daily due to level of 0.9 ng/mL. Repeat level was <0.4 ng/mL. Overall she is feeling well today. Only complaint is cough that has been bothering her for ~5 weeks. Has completed course of prednisone with no relief.  Stated she went to [facility] ED where Chest X-ray and echo were normal. She states today that she still has the cough, but it has improved. No dizziness, lightheadedness, chest pain or palpitations. She exercises for 1 hour 3 times a week. Thinks her exercise capacity has improved. Is able to do more around the house. She does not take any diuretic. Her weight has been stable at home. No LEE, PND or orthopnea. Taking all medications as prescribed and tolerating all medications. Received 1st dose of the COVID vaccine on 03/21/2020.    HF Medications: Carvedilol 12.5 mg BID  Entresto 97/103 mg BID  Spironolactone 25 mg daily Dapagliflozin 10 mg daily Digoxin 0.0625 mg daily  Has the patient been experiencing any side effects to the medications prescribed?  no   Does the patient have any problems obtaining medications due to transportation or finances?   Yes - no prescription insurance. Gets manufacturer's assistance for [employer] and dapagliflozin  Understanding of regimen: good Understanding of indications: good Potential of compliance: good Patient understands to avoid NSAIDs. Patient understands to avoid decongestants.    Pertinent Lab Values (03/03/20): Marland Kitchen Serum creatinine 0.73, BUN 13, Potassium 4.2, Sodium 140, BNP 33.8 pg/mL (02/23/2020)  Vital Signs . Weight: 179.2 lbs (last clinic weight: 179 lbs) . Blood pressure: 110/64 . Heart rate: 78  Assessment: 1. 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 EF < 20% - cMRI 1/21 LVEF 16% with no LGE Mild MR. Normal  RV - Echo 10/01/19 EF < 20% normal RV.  - Echo 01/21/20 EF 25%, RV normal   - NYHA II , euvolemic on exam.  - Increase carvedilol to 18.75 mg BID for 2 weeks, then increase to 25 mg BID.  - Continue Entresto 97/103 mg BID.  - Continue spironolactone 25 mg daily.  - Continue dapagliflozin 10 mg daily - Continue digoxin 0.0625 mg daily - Etiology of NICM unclear. EF improving slowly.  - Continue LifeVest  (no VT/ no shocks on interrogation report)  - Overall improved. On excellent GDMT. EF improving slowly. Have decided to continue LifeVest and hold off on ICD for now, hopeful that EF will continue to recover. - Plan to repeat echo in 04/2020 to reassess LVEF   2. Pre-syncopal episode - At high risk for ventricular arrhythmias but no events on Lifevest - Continue Lifevest - Discussed possible need for ICD down the road if EF not improved on next echo    Plan: 1) Medication changes: Based on clinical presentation, vital signs and recent labs will increase carvedilol to 18.75 mg BID for 2 weeks, then increase to 25 mg BID.  3.) Follow-up: 2 months with Dr. Gala Romney plus echo.    Karle Plumber, PharmD, BCPS, BCCP, CPP Heart Failure Clinic Pharmacist [phone]

## 2020-03-14 ENCOUNTER — Telehealth (HOSPITAL_COMMUNITY): Payer: Self-pay | Admitting: Vascular Surgery

## 2020-03-14 NOTE — Telephone Encounter (Signed)
No appointments available for Dr.Bensimhon or the APP clinic. Forwarded to Dr.Bensimhons nurse Meredith Staggers

## 2020-03-14 NOTE — Telephone Encounter (Addendum)
Pt called scheduling line to make appt, pt states she been having a cough for 3 weeks and she would like to been seen, I asked pt did she contact  pcp, she stated pcp said she had pneumonia , they gave her medications, then she went to ED, they stated nothing was wrong, she believe the cough has something to do w/ her Heart Failure, pt was seen in 8/31 in HF clinic , pt has Pham appt 9/30 office , pt states she cant wait unitl 9/30 to be seen .. please advise

## 2020-03-15 NOTE — Telephone Encounter (Signed)
Spoke with pt. Pt states that weight has remained the same, no other symptoms except the cough. She stated that the cough has improved and only happens upon exertion. No known COVID exposures and has 3 negative COVID tests.

## 2020-03-23 ENCOUNTER — Other Ambulatory Visit (HOSPITAL_COMMUNITY): Payer: Self-pay | Admitting: Internal Medicine

## 2020-03-24 ENCOUNTER — Telehealth (HOSPITAL_COMMUNITY): Payer: Self-pay | Admitting: Internal Medicine

## 2020-03-24 ENCOUNTER — Ambulatory Visit (HOSPITAL_COMMUNITY)
Admission: RE | Admit: 2020-03-24 | Discharge: 2020-03-24 | Disposition: A | Discharge status: Home / Self Care | Payer: Medicaid Other | Source: Ambulatory Visit | Attending: Internal Medicine | Admitting: Internal Medicine

## 2020-03-24 ENCOUNTER — Other Ambulatory Visit: Payer: Self-pay

## 2020-03-24 DIAGNOSIS — I428 Other cardiomyopathies: Secondary | ICD-10-CM | POA: Insufficient documentation

## 2020-03-24 DIAGNOSIS — I5020 Unspecified systolic (congestive) heart failure: Secondary | ICD-10-CM

## 2020-03-24 DIAGNOSIS — I5022 Chronic systolic (congestive) heart failure: Secondary | ICD-10-CM

## 2020-03-24 DIAGNOSIS — G8929 Other chronic pain: Secondary | ICD-10-CM | POA: Insufficient documentation

## 2020-03-24 DIAGNOSIS — M549 Dorsalgia, unspecified: Secondary | ICD-10-CM | POA: Insufficient documentation

## 2020-03-24 MED ORDER — CARVEDILOL 25 MG PO TABS: 25.0000 mg | ORAL_TABLET | Freq: Two times a day (BID) | ORAL | 3 refills | Status: DC

## 2020-03-24 MED ORDER — SPIRONOLACTONE 25 MG PO TABS: 25.0000 mg | ORAL_TABLET | Freq: Every day | ORAL | 3 refills | Status: DC

## 2020-03-24 NOTE — Patient Instructions (Signed)
It was a pleasure seeing you today!  MEDICATIONS: -We are changing your medications today -Increase carvedilol to 18.75 mg (1.5 tablets) twice daily for 2 weeks, then increase to carvedilol 25 mg twice daily.  -Call if you have questions about your medications.   NEXT APPOINTMENT: Return to clinic in 2 months with Dr. Gala Romney.  In general, to take care of your heart failure: -Limit your fluid intake to 2 Liters (half-gallon) per day.   -Limit your salt intake to ideally 2-3 grams (2000-3000 mg) per day. -Weigh yourself daily and record, and bring that "weight diary" to your next appointment.  (Weight gain of 2-3 pounds in 1 day typically means fluid weight.) -The medications for your heart are to help your heart and help you live longer.   -Please contact us before stopping any of your heart medications.  Call the clinic at [phone] with questions or to reschedule future appointments.

## 2020-03-24 NOTE — Telephone Encounter (Signed)
Need order for Echo, Thanks

## 2020-03-29 ENCOUNTER — Other Ambulatory Visit: Payer: Self-pay

## 2020-03-29 MED ORDER — ATORVASTATIN CALCIUM 40 MG PO TABS
40.0000 mg | ORAL_TABLET | Freq: Every day | ORAL | 2 refills | Status: DC
Start: 2020-03-29 — End: 2020-12-23

## 2020-03-29 NOTE — Telephone Encounter (Signed)
Refill sent to [facility]  for Atorvastatin.

## 2020-04-04 ENCOUNTER — Encounter: Payer: Medicaid Other | Attending: Cardiology | Admitting: Skilled Nursing Facility1

## 2020-04-04 ENCOUNTER — Other Ambulatory Visit: Payer: Self-pay

## 2020-04-04 ENCOUNTER — Encounter: Payer: Self-pay | Admitting: Skilled Nursing Facility1

## 2020-04-04 DIAGNOSIS — I5020 Unspecified systolic (congestive) heart failure: Secondary | ICD-10-CM | POA: Insufficient documentation

## 2020-04-04 NOTE — Progress Notes (Signed)
°  Assessment:  Primary concerns today: weight loss.   Pt states she is following a low potassium and low sodium diet (1200 mg) and a 2 liter fluid restriction per order of her physician (dietitian unable to locate these recommendations in prior notes).  Pt states she is trying to avoid high in sodium fish and beef and pork.  Pt states she gets exhausted by the end of the day.  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 Pt states she avoids shaking salt on her food.  Pt states she is pretty stressed.  Pt states she has been skipping lunch due to the belief if she did this it would help with weight loss.   MEDICATIONS: see list   DIETARY INTAKE:  Usual eating pattern includes 2 meals and 1 snacks per day.  Everyday foods include none stated.  Avoided foods include beef/pork.    24-hr recall: 67 oz B ( AM): coffee + pear + banana Snk ( AM):  L ( PM): skipped Snk ( PM):  D ( PM): half hamburger + ice cream  Snk ( PM): banana Beverages: 1-2 coffee + half and half, water 1 16.9 oz  Usual physical activity: 3 days a week rehab   Estimated energy needs: 1600 calories  Progress Towards Goal(s):  In progress.    Intervention:  Nutrition counseling. Dietitian educated pt on a healthy diet within the context of heart failure and weight loss.  Why you need complex carbohydrates: Whole grains and other complex carbohydrates are required to have a healthy diet. Whole grains provide fiber which can help with blood glucose levels and help keep you satiated. Fruits and starchy vegetables provide essential vitamins and minerals required for immune function, eyesight support, brain support, bone density, wound healing and many other functions within the body. According to the current evidenced based 2020-2025 Dietary Guidelines for Americans, complex carbohydrates are part of a healthy eating pattern which is associated with a decreased risk for type 2 diabetes, cancers, and cardiovascular disease.  Importance of  vegetables To have an overall healthy diet, adult men and women are recommended to consume anywhere from 2-3 cups of vegetables daily. Vegetables provide a wide range of vitamins and minerals such as vitamin A, vitamin C, potassium, and folic acid. According to the [employer], including fruit and vegetables daily may reduce the risk of cardiovascular disease, certain cancers, and other non-communicable diseases.   Goals: Aim for 2 bottles of water per day Aim for non starchy vegetables 2 times a day 7 days a week Avoid all processed meats such as sausage, hot dogs, and lunch meat Avoid eating meals and snacks out Include more plant based proteins such as soy, pea, and lentils  Aim to eat every 3-5 hours if you do not have an appetite for lunch have a smoothie: tofu + fruit + spinach + soy milk  Teaching Method Utilized:  Visual Auditory Hands on  Handouts given during visit include:  Detailed MyPlate: marked specifically for pt inclusive of goals pt created   Hobby ideas  Barriers to learning/adherence to lifestyle change: none identified at this time  Demonstrated degree of understanding via:  Teach Back   Monitoring/Evaluation:  Dietary intake, exercise, and body weight prn.

## 2020-04-05 DIAGNOSIS — I428 Other cardiomyopathies: Secondary | ICD-10-CM | POA: Insufficient documentation

## 2020-04-05 DIAGNOSIS — E785 Hyperlipidemia, unspecified: Secondary | ICD-10-CM | POA: Insufficient documentation

## 2020-04-11 ENCOUNTER — Other Ambulatory Visit: Payer: Self-pay

## 2020-04-11 ENCOUNTER — Ambulatory Visit (INDEPENDENT_AMBULATORY_CARE_PROVIDER_SITE_OTHER): Payer: Self-pay | Admitting: Cardiology

## 2020-04-11 ENCOUNTER — Encounter: Payer: Self-pay | Admitting: Cardiology

## 2020-04-11 VITALS — BP 96/65 | HR 84 | Ht 66.0 in | Wt 182.6 lb

## 2020-04-11 DIAGNOSIS — I428 Other cardiomyopathies: Secondary | ICD-10-CM

## 2020-04-11 DIAGNOSIS — E782 Mixed hyperlipidemia: Secondary | ICD-10-CM

## 2020-04-11 DIAGNOSIS — I5020 Unspecified systolic (congestive) heart failure: Secondary | ICD-10-CM

## 2020-04-11 NOTE — Patient Instructions (Signed)

## 2020-04-11 NOTE — Progress Notes (Signed)
Cardiology Office Note:    Date:  04/11/2020   ID:  [name], DOB 1960/06/26, MRN [id number]  PCP:  Lise Auer, MD  Cardiologist:  Thomasene Ripple, DO  Electrophysiologist:  None   Referring MD: [name], MD   " I am doing fine"  History of Present Illness:    Krista Jenkins is a 59 y.o. female with a hx of nonischemic cardiomyopathy most recent EF 20 to 25% in July 2021, chronic heart failure with reduced ejection fraction, hyperlipidemia presents today for a follow-up visit.  The patient has been following the heart failure clinic as well as well as our pharmacy team and has titrated her medication on optimal doses.  She tells me she is still doing cardiac rehab.  And has been able to do her daily activities at home.  Her only concern today is a swelling on her inner knee around the muscle.  No other complaints.  She complains her LifeVest daily and has not had any shocks.  Past Medical History:  Diagnosis Date  . ACC/AHA stage C systolic heart failure (HCC) 08/21/2019  . Acute systolic CHF (congestive heart failure) (HCC) 06/10/2019  . Asthma 05/16/2018  . Cervical radiculopathy 06/27/2018  . Chronic back pain 05/16/2018  . Degeneration of lumbar intervertebral disc 06/27/2018  . Depressed left ventricular ejection fraction   . Heart failure with reduced ejection fraction (HCC)   . Hyperlipidemia   . Lumbar radiculopathy 06/28/2018  . Mixed hyperlipidemia 08/21/2019  . Neck pain 05/16/2018  . Nonischemic cardiomyopathy (HCC)   . 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 Scoliosis deformity of spine 07/17/2018    Past Surgical History:  Procedure Laterality Date  . CESAREAN SECTION    . RIGHT/LEFT HEART CATH AND CORONARY ANGIOGRAPHY N/A 06/11/2019   Procedure: RIGHT/LEFT HEART CATH AND CORONARY ANGIOGRAPHY;  Surgeon: Corky Crafts, MD;  Location: [facility] INVASIVE CV LAB;  Service: Cardiovascular;  Laterality: N/A;    Current  Medications: Current Meds  Medication Sig  . amitriptyline (ELAVIL) 25 MG tablet Take 50 mg by mouth at bedtime.  Marland Kitchen atorvastatin (LIPITOR) 40 MG tablet Take 1 tablet (40 mg total) by mouth daily at 6 PM.  . carvedilol (COREG) 25 MG tablet Take 1 tablet (25 mg total) by mouth 2 (two) times daily with a meal.  . citalopram (CELEXA) 10 MG tablet Take 10 mg by mouth daily.  . dapagliflozin propanediol (FARXIGA) 10 MG TABS tablet Take 10 mg by mouth daily before breakfast.  . digoxin (LANOXIN) 0.125 MG tablet Take 125 mcg by mouth daily. TAKE 0.5 TABLET ONCE A DAY  . diphenhydramine-acetaminophen (TYLENOL PM) 25-500 MG TABS tablet Take 1 tablet by mouth at bedtime as needed (for pain).  . sacubitril-valsartan (ENTRESTO) 97-103 MG Take 1 tablet by mouth 2 (two) times daily.  Marland Kitchen spironolactone (ALDACTONE) 25 MG tablet Take 1 tablet (25 mg total) by mouth daily.  . [DISCONTINUED] digoxin 62.5 MCG TABS Take 0.0625 mg by mouth daily.     Allergies:   Latex   Social History   Socioeconomic History  . Marital status: Unknown    Spouse name: Not on file  . Number of children: Not on file  . Years of education: Not on file  . Highest education level: Not on file  Occupational History  . Not on file  Tobacco Use  . Smoking status: Former Games developer  . Smokeless tobacco: Never Used  Substance and Sexual Activity  .  Alcohol use: Not Currently  . Drug use: Not on file  . Sexual activity: Not on file  Other Topics Concern  . Not on file  Social History Narrative  . Not on file   Social Determinants of Health   Financial Resource Strain:   . Difficulty of Paying Living Expenses: Not on file  Food Insecurity:   . Worried About Programme researcher, broadcasting/film/video in the Last Year: Not on file  . Ran Out of Food in the Last Year: Not on file  Transportation Needs:   . Lack of Transportation (Medical): Not on file  . Lack of Transportation (Non-Medical): Not on file  Physical Activity:   . Days of Exercise per  Week: Not on file  . Minutes of Exercise per Session: Not on file  Stress:   . Feeling of Stress : Not on file  Social Connections:   . Frequency of Communication with Friends and Family: Not on file  . Frequency of Social Gatherings with Friends and Family: Not on file  . Attends Religious Services: Not on file  . Active Member of Clubs or Organizations: Not on file  . Attends Banker Meetings: Not on file  . Marital Status: Not on file     Family History: The patient's family history includes CAD in her father; Hypertension in her brother.  ROS:   Review of Systems  Constitution: Negative for decreased appetite, fever and weight gain.  HENT: Negative for congestion, ear discharge, hoarse voice and sore throat.   Eyes: Negative for discharge, redness, vision loss in right eye and visual halos.  Cardiovascular: Negative for chest pain, dyspnea on exertion, leg swelling, orthopnea and palpitations.  Respiratory: Negative for cough, hemoptysis, shortness of breath and snoring.   Endocrine: Negative for heat intolerance and polyphagia.  Hematologic/Lymphatic: Negative for bleeding problem. Does not bruise/bleed easily.  Skin: Negative for flushing, nail changes, rash and suspicious lesions.  Musculoskeletal: Negative for arthritis, joint pain, muscle cramps, myalgias, neck pain and stiffness.  Gastrointestinal: Negative for abdominal pain, bowel incontinence, diarrhea and excessive appetite.  Genitourinary: Negative for decreased libido, genital sores and incomplete emptying.  Neurological: Negative for brief paralysis, focal weakness, headaches and loss of balance.  Psychiatric/Behavioral: Negative for altered mental status, depression and suicidal ideas.  Allergic/Immunologic: Negative for HIV exposure and persistent infections.    EKGs/Labs/Other Studies Reviewed:    The following studies were reviewed today:   EKG: None today. Recent Labs: 06/10/2019: ALT  42 10/20/2019: TSH 0.702 11/11/2019: Hemoglobin 14.9; Platelets 235 01/04/2020: Magnesium 2.0 02/23/2020: B Natriuretic Peptide 33.8 03/03/2020: BUN 13; Creatinine, Ser 0.73; Potassium 4.2; Sodium 140  Recent Lipid Panel No results found for: CHOL, TRIG, HDL, CHOLHDL, VLDL, LDLCALC, LDLDIRECT  Physical Exam:    VS:  BP 96/65   Pulse 84   Ht 5\' 6"  (1.676 m)   Wt 182 lb 9.6 oz (82.8 kg)   LMP  (LMP Unknown)   SpO2 94%   BMI 29.47 kg/m     Wt Readings from Last 3 Encounters:  04/11/20 182 lb 9.6 oz (82.8 kg)  03/24/20 179 lb 3.2 oz (81.3 kg)  02/23/20 179 lb (81.2 kg)     GEN: Well nourished, well developed in no acute distress HEENT: Normal NECK: No JVD; No carotid bruits LYMPHATICS: No lymphadenopathy CARDIAC: S1S2 noted,RRR, no murmurs, rubs, gallops RESPIRATORY:  Clear to auscultation without rales, wheezing or rhonchi  ABDOMEN: Soft, non-tender, non-distended, +bowel sounds, no guarding. EXTREMITIES: No edema, No  cyanosis, no clubbing MUSCULOSKELETAL:  No deformity  SKIN: Warm and dry NEUROLOGIC:  Alert and oriented x 3, non-focal PSYCHIATRIC:  Normal affect, good insight  ASSESSMENT:    1. ACC/AHA stage C systolic heart failure (HCC)   2. Heart failure with reduced ejection fraction (HCC)   3. Mixed hyperlipidemia   4. Nonischemic cardiomyopathy (HCC)    PLAN:    Encounter continue patient her current medication regimen which includes carvedilol 25 mg twice a day, Farxiga 10 mg daily, Entresto 97-and Aldactone 25 mg daily.-103 mg twice daily.  Her blood pressure is slightly lower but she does not have any symptoms I am going to keep her on her same doses of medicine.  Continue atorvastatin 40 mg daily.  Plan for repeat echocardiogram later this month.  She also will follow up with our advanced heart failure team later this month as well.   The patient is in agreement with the above plan. The patient left the office in stable condition.  The patient will follow up  in 4 months or sooner if needed.  Medication Adjustments/Labs and Tests Ordered: Current medicines are reviewed at length with the patient today.  Concerns regarding medicines are outlined above.  No orders of the defined types were placed in this encounter.  No orders of the defined types were placed in this encounter.   Patient Instructions  Medication Instructions:  Your physician recommends that you continue on your current medications as directed. Please refer to the Current Medication list given to you today.  *If you need a refill on your cardiac medications before your next appointment, please call your pharmacy*   Lab Work: None If you have labs (blood work) drawn today and your tests are completely normal, you will receive your results only by: Marland Kitchen MyChart Message (if you have MyChart) OR . A paper copy in the mail If you have any lab test that is abnormal or we need to change your treatment, we will call you to review the results.   Testing/Procedures: None   Follow-Up: At [facility], you and your health needs are our priority.  As part of our continuing mission to provide you with exceptional heart care, we have created designated Provider Care Teams.  These Care Teams include your primary Cardiologist (physician) and Advanced Practice Providers (APPs -  Physician Assistants and Nurse Practitioners) who all work together to provide you with the care you need, when you need it.  We recommend signing up for the patient portal called "MyChart".  Sign up information is provided on this After Visit Summary.  MyChart is used to connect with patients for Virtual Visits (Telemedicine).  Patients are able to view lab/test results, encounter notes, upcoming appointments, etc.  Non-urgent messages can be sent to your provider as well.   To learn more about what you can do with MyChart, go to [email or web address].    Your next appointment:   4 month(s)  The format for your  next appointment:   In Person  Provider:   Thomasene Ripple, DO   Other Instructions      Adopting a Healthy Lifestyle.  Know what a healthy weight is for you (roughly BMI <25) and aim to maintain this   Aim for 7+ servings of fruits and vegetables daily   65-80+ fluid ounces of water or unsweet tea for healthy kidneys   Limit to max 1 drink of alcohol per day; avoid smoking/tobacco   Limit animal fats in diet for cholesterol  and heart health - choose grass fed whenever available   Avoid highly processed foods, and foods high in saturated/trans fats   Aim for low stress - take time to unwind and care for your mental health   Aim for 150 min of moderate intensity exercise weekly for heart health, and weights twice weekly for bone health   Aim for 7-9 hours of sleep daily   When it comes to diets, agreement about the perfect plan isnt easy to find, even among the experts. Experts at the [facility] [employer] developed an idea known as the Healthy Eating Plate. Just imagine a plate divided into logical, healthy portions.   The emphasis is on diet quality:   Load up on vegetables and fruits - one-half of your plate: Aim for color and variety, and remember that potatoes dont count.   Go for whole grains - one-quarter of your plate: Whole wheat, barley, wheat berries, quinoa, oats, brown rice, and foods made with them. If you want pasta, go with whole wheat pasta.   Protein power - one-quarter of your plate: Fish, chicken, beans, and nuts are all healthy, versatile protein sources. Limit red meat.   The diet, however, does go beyond the plate, offering a few other suggestions.   Use healthy plant oils, such as olive, canola, soy, corn, sunflower and peanut. Check the labels, and avoid partially hydrogenated oil, which have unhealthy trans fats.   If youre thirsty, drink water. Coffee and tea are good in moderation, but skip sugary drinks and limit milk and dairy  products to one or two daily servings.   The type of carbohydrate in the diet is more important than the amount. Some sources of carbohydrates, such as vegetables, fruits, whole grains, and beans-are healthier than others.   Finally, stay active  Signed, Thomasene Ripple, DO  04/11/2020 5:51 PM    [facility]

## 2020-05-02 ENCOUNTER — Encounter (HOSPITAL_COMMUNITY): Payer: Self-pay

## 2020-05-02 DIAGNOSIS — I5022 Chronic systolic (congestive) heart failure: Secondary | ICD-10-CM

## 2020-05-09 MED ORDER — CARVEDILOL 25 MG PO TABS: 50.0000 mg | ORAL_TABLET | Freq: Two times a day (BID) | ORAL | 3 refills | Status: DC

## 2020-05-15 NOTE — Progress Notes (Signed)
ADVANCED HF CLINIC NOTE  Referring Physician: Dr. [name] Primary Care: Dr. Luna Kitchens [facility])  Primary Cardiologist: Dr. [name] [facility]: Dr. Gala Romney   Reason for Visit: f/u for chronic systolic heart failure, med titration   HPI:  Krista Jenkins a 59 y.o.femalewith a HL, chronic back pain and systolic HF due to NICM referred by Dr. [name] for further evaluation of her HF.  In November was coughing and went to PCP with cough and SOB. Diagnosed with bronchitis/PNA. No f/c. Was sob with bad cough. Presented to [facility] in 12/20 with new onset HF. EF 20-25% on echo. Transferred to [facility] for cath. Cath with EF < 25% and normal cors. Pulmonary capillary wedge pressure was 15 mmHg and a mean pulmonary pressure was 18 mmHg,cardiac output 4.9 L with a cardiac index of 2.5.   cMRI on 1/21 LVEF 16% No LGEMild MR. Normal RV Echo 10/01/19 EF < 20% normal RV.  Echo 01/21/20, EF slightly improved 25% RV normal.  She presents to clinic today for f/u. Here w/ her husband. Reports full compliance w/ meds. Feels good. Mild SOB with some activity. No orthopnea or PND. Still with some edema. Feels like she is gaining weight. Wearing LifeVest.   Echo today EF 40-45% RV ok. Personally reviewed'   Past Medical History:  Diagnosis Date  . ACC/AHA stage C systolic heart failure (HCC) 08/21/2019  . Acute systolic CHF (congestive heart failure) (HCC) 06/10/2019  . Asthma 05/16/2018  . Cervical radiculopathy 06/27/2018  . Chronic back pain 05/16/2018  . Degeneration of lumbar intervertebral disc 06/27/2018  . Depressed left ventricular ejection fraction   . Heart failure with reduced ejection fraction (HCC)   . Hyperlipidemia   . Lumbar radiculopathy 06/28/2018  . Mixed hyperlipidemia 08/21/2019  . Neck pain 05/16/2018  . Nonischemic cardiomyopathy (HCC)   . 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 Scoliosis deformity of spine 07/17/2018    Current Outpatient  Medications  Medication Sig Dispense Refill  . amitriptyline (ELAVIL) 25 MG tablet Take 50 mg by mouth at bedtime.    Marland Kitchen atorvastatin (LIPITOR) 40 MG tablet Take 1 tablet (40 mg total) by mouth daily at 6 PM. 90 tablet 2  . carvedilol (COREG) 25 MG tablet Take 2 tablets (50 mg total) by mouth 2 (two) times daily with a meal. 180 tablet 3  . citalopram (CELEXA) 10 MG tablet Take 10 mg by mouth daily.    . dapagliflozin propanediol (FARXIGA) 10 MG TABS tablet Take 10 mg by mouth daily before breakfast. 30 tablet 5  . digoxin (LANOXIN) 0.125 MG tablet Take 125 mcg by mouth daily. TAKE 0.5 TABLET ONCE A DAY    . diphenhydramine-acetaminophen (TYLENOL PM) 25-500 MG TABS tablet Take 1 tablet by mouth at bedtime as needed (for pain).    . sacubitril-valsartan (ENTRESTO) 97-103 MG Take 1 tablet by mouth 2 (two) times daily. 180 tablet 3  . spironolactone (ALDACTONE) 25 MG tablet Take 1 tablet (25 mg total) by mouth daily. 90 tablet 3   No current facility-administered medications for this encounter.    Allergies  Allergen Reactions  . Latex       Social History   Socioeconomic History  . Marital status: Unknown    Spouse name: Not on file  . Number of children: Not on file  . Years of education: Not on file  . Highest education level: Not on file  Occupational History  . Not on  file  Tobacco Use  . Smoking status: Former Games developer  . Smokeless tobacco: Never Used  Substance and Sexual Activity  . Alcohol use: Not Currently  . Drug use: Not on file  . Sexual activity: Not on file  Other Topics Concern  . Not on file  Social History Narrative  . Not on file   Social Determinants of Health   Financial Resource Strain:   . Difficulty of Paying Living Expenses: Not on file  Food Insecurity:   . Worried About Programme researcher, broadcasting/film/video in the Last Year: Not on file  . Ran Out of Food in the Last Year: Not on file  Transportation Needs:   . Lack of Transportation (Medical): Not on file  .  Lack of Transportation (Non-Medical): Not on file  Physical Activity:   . Days of Exercise per Week: Not on file  . Minutes of Exercise per Session: Not on file  Stress:   . Feeling of Stress : Not on file  Social Connections:   . Frequency of Communication with Friends and Family: Not on file  . Frequency of Social Gatherings with Friends and Family: Not on file  . Attends Religious Services: Not on file  . Active Member of Clubs or Organizations: Not on file  . Attends Banker Meetings: Not on file  . Marital Status: Not on file  Intimate Partner Violence:   . Fear of Current or Ex-Partner: Not on file  . Emotionally Abused: Not on file  . Physically Abused: Not on file  . Sexually Abused: Not on file      Family History  Problem Relation Age of Onset  . CAD Father   . Hypertension Brother     Vitals:   05/16/20 1505  BP: (!) 144/90  Pulse: 88  SpO2: 97%  Weight: 85.1 kg (187 lb 11.2 oz)    PHYSICAL EXAM: General:  Well appearing. No resp difficulty HEENT: normal Neck: supple. no JVD. Carotids 2+ bilat; no bruits. No lymphadenopathy or thryomegaly appreciated. Cor: PMI nondisplaced. Regular rate & rhythm. No rubs, gallops or murmurs. Lungs: clear Abdomen: soft, nontender, nondistended. No hepatosplenomegaly. No bruits or masses. Good bowel sounds. Extremities: no cyanosis, clubbing, rash, edema Neuro: alert & orientedx3, cranial nerves grossly intact. moves all 4 extremities w/o difficulty. Affect pleasant   ASSESSMENT & PLAN:  1. 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 EF < 20% - cMRI 1/21 LVEF 16% with no LGE Mild MR. Normal RV - Echo 10/01/19 EF < 20% normal RV.  - Echo 01/21/20 EF 25%, RV normal  - Echo today 05/15/20 EF 40-45% RV normal  - euvolemic on exam. Wt stable - NYHA Class II, BP 126/92. HR 94 bpm  - Continue Entresto to 97/103 bid - Continue carvedilol 25 mg bid - Continue Farxiga 10 mg - Stop digoxin and  LifeVest today  - Continue spiro 25 mg   2. HTN - BP high at home  - Keep BP log for 3-4 weeks - Let me know if SBP is consistently > 140   Arvilla Meres, MD  3:17 PM

## 2020-05-16 ENCOUNTER — Ambulatory Visit (HOSPITAL_COMMUNITY)
Admission: RE | Admit: 2020-05-16 | Discharge: 2020-05-16 | Disposition: A | Discharge status: Home / Self Care | Payer: Self-pay | Source: Ambulatory Visit | Attending: Nephrology | Admitting: Nephrology

## 2020-05-16 ENCOUNTER — Encounter (HOSPITAL_COMMUNITY): Payer: Self-pay | Admitting: Internal Medicine

## 2020-05-16 ENCOUNTER — Ambulatory Visit (HOSPITAL_BASED_OUTPATIENT_CLINIC_OR_DEPARTMENT_OTHER)
Admission: RE | Admit: 2020-05-16 | Discharge: 2020-05-16 | Disposition: A | Discharge status: Home / Self Care | Payer: Self-pay | Source: Ambulatory Visit | Attending: Internal Medicine | Admitting: Internal Medicine

## 2020-05-16 ENCOUNTER — Other Ambulatory Visit: Payer: Self-pay

## 2020-05-16 VITALS — BP 144/90 | HR 88 | Wt 187.7 lb

## 2020-05-16 DIAGNOSIS — I428 Other cardiomyopathies: Secondary | ICD-10-CM | POA: Insufficient documentation

## 2020-05-16 DIAGNOSIS — I5022 Chronic systolic (congestive) heart failure: Secondary | ICD-10-CM

## 2020-05-16 DIAGNOSIS — I10 Essential (primary) hypertension: Secondary | ICD-10-CM

## 2020-05-16 DIAGNOSIS — Z87891 Personal history of nicotine dependence: Secondary | ICD-10-CM | POA: Insufficient documentation

## 2020-05-16 DIAGNOSIS — I5020 Unspecified systolic (congestive) heart failure: Secondary | ICD-10-CM

## 2020-05-16 DIAGNOSIS — G8929 Other chronic pain: Secondary | ICD-10-CM | POA: Insufficient documentation

## 2020-05-16 DIAGNOSIS — Z79899 Other long term (current) drug therapy: Secondary | ICD-10-CM | POA: Insufficient documentation

## 2020-05-16 DIAGNOSIS — M549 Dorsalgia, unspecified: Secondary | ICD-10-CM | POA: Insufficient documentation

## 2020-05-16 DIAGNOSIS — I110 Hypertensive heart disease with heart failure: Secondary | ICD-10-CM | POA: Insufficient documentation

## 2020-05-16 DIAGNOSIS — Z8249 Family history of ischemic heart disease and other diseases of the circulatory system: Secondary | ICD-10-CM | POA: Insufficient documentation

## 2020-05-16 LAB — ECHOCARDIOGRAM COMPLETE
Area-P 1/2: 4.15 cm2
Calc EF: 37.7 %
S' Lateral: 4.3 cm
Single Plane A2C EF: 36.5 %
Single Plane A4C EF: 45.5 %

## 2020-05-16 LAB — BRAIN NATRIURETIC PEPTIDE: B Natriuretic Peptide: 18.2 pg/mL (ref 0.0–100.0)

## 2020-05-16 LAB — BASIC METABOLIC PANEL
Anion gap: 10 (ref 5–15)
BUN: 13 mg/dL (ref 6–20)
CO2: 25 mmol/L (ref 22–32)
Calcium: 9.4 mg/dL (ref 8.9–10.3)
Chloride: 104 mmol/L (ref 98–111)
Creatinine, Ser: 0.77 mg/dL (ref 0.44–1.00)
GFR, Estimated: >60 mL/min (ref >60)
Glucose, Bld: 114 mg/dL — ABNORMAL HIGH (ref 70–99)
Potassium: 4.1 mmol/L (ref 3.5–5.1)
Sodium: 139 mmol/L (ref 135–145)

## 2020-05-16 MED ORDER — CARVEDILOL 25 MG PO TABS
25.0000 mg | ORAL_TABLET | Freq: Two times a day (BID) | ORAL | 3 refills | Status: DC
Start: 2020-05-16 — End: 2021-05-02

## 2020-05-16 NOTE — Patient Instructions (Signed)
Stop Digoxin  Continue Carvedilol 25 mg Twice daily   Labs done today, your results will be available in MyChart, we will contact you for abnormal readings.  Your physician has requested that you regularly monitor and record your blood pressure readings at home. Please use the same machine at the same time of day to check your readings and record them to bring to your follow-up visit.  Your physician recommends that you schedule a follow-up appointment in: 3-4 months  If you have any questions or concerns before your next appointment please send [place] a message through [place] or call our office at [phone].    TO LEAVE A MESSAGE FOR THE NURSE SELECT OPTION 2, PLEASE LEAVE A MESSAGE INCLUDING: . YOUR NAME . DATE OF BIRTH . CALL BACK NUMBER . REASON FOR CALL**this is important as we prioritize the call backs  YOU WILL RECEIVE A CALL BACK THE SAME DAY AS LONG AS YOU CALL BEFORE 4:00 PM  At the [facility], you and your health needs are our priority. As part of our continuing mission to provide you with exceptional heart care, we have created designated Provider Care Teams. These Care Teams include your primary Cardiologist (physician) and Advanced Practice Providers (APPs- Physician Assistants and Nurse Practitioners) who all work together to provide you with the care you need, when you need it.   You may see any of the following providers on your designated Care Team at your next follow up: Marland Kitchen Dr Arvilla Meres . Dr Marca Ancona . Tonye Becket, NP . Robbie Lis, PA . Karle Plumber, PharmD   Please be sure to bring in all your medications bottles to every appointment.

## 2020-05-16 NOTE — Progress Notes (Signed)
  Echocardiogram 2D Echocardiogram has been performed.  Krista Jenkins 05/16/2020, 2:00 PM

## 2020-05-16 NOTE — Addendum Note (Signed)
Encounter addended by: Noralee Space, RN on: 05/16/2020 3:36 PM  Actions taken: Pharmacy for encounter modified, Diagnosis association updated, Clinical Note Signed, Charge Capture section accepted, Order list changed, Medication long-term status modified

## 2020-08-15 ENCOUNTER — Other Ambulatory Visit (HOSPITAL_COMMUNITY): Payer: Self-pay

## 2020-08-15 MED ORDER — SACUBITRIL-VALSARTAN 97-103 MG PO TABS: 1.0000 | ORAL_TABLET | Freq: Two times a day (BID) | ORAL | 3 refills | Status: DC

## 2020-08-16 ENCOUNTER — Ambulatory Visit (INDEPENDENT_AMBULATORY_CARE_PROVIDER_SITE_OTHER): Payer: Self-pay | Admitting: Cardiology

## 2020-08-16 ENCOUNTER — Encounter: Payer: Self-pay | Admitting: Cardiology

## 2020-08-16 ENCOUNTER — Other Ambulatory Visit: Payer: Self-pay

## 2020-08-16 VITALS — BP 118/80 | HR 86 | Ht 66.0 in | Wt 194.8 lb

## 2020-08-16 DIAGNOSIS — I5020 Unspecified systolic (congestive) heart failure: Secondary | ICD-10-CM

## 2020-08-16 DIAGNOSIS — I428 Other cardiomyopathies: Secondary | ICD-10-CM

## 2020-08-16 DIAGNOSIS — E669 Obesity, unspecified: Secondary | ICD-10-CM

## 2020-08-16 DIAGNOSIS — E782 Mixed hyperlipidemia: Secondary | ICD-10-CM

## 2020-08-16 NOTE — Patient Instructions (Signed)

## 2020-08-16 NOTE — Progress Notes (Signed)
Cardiology Office Note:    Date:  08/16/2020   ID:  Krista Jenkins, DOB 10/05/1960, MRN [id number]  PCP:  Lise Auer, MD  Cardiologist:  Thomasene Ripple, DO  Electrophysiologist:  None   Referring MD: Lise Auer, MD   I am doing well  History of Present Illness:    Krista Jenkins is a 60 y.o. female with a hx of nonischemic cardiomyopathy her recent EF has improved is now 40 to 45% from echo done in November 2021 1 prior EF was 20 to 25% in July 2021, chronic heart failure with reduced ejection fraction, hyperlipidemia presents today for a follow-up visit.  At her last visit her LifeVest was stopped and she is really happy.  Her only complaint is that she is starting to gain more weight.  Past Medical History:  Diagnosis Date  . ACC/AHA stage C systolic heart failure (HCC) 08/21/2019  . Acute systolic CHF (congestive heart failure) (HCC) 06/10/2019  . Asthma 05/16/2018  . Cervical radiculopathy 06/27/2018  . Chronic back pain 05/16/2018  . Degeneration of lumbar intervertebral disc 06/27/2018  . Depressed left ventricular ejection fraction   . Heart failure with reduced ejection fraction (HCC)   . Hyperlipidemia   . Lumbar radiculopathy 06/28/2018  . Mixed hyperlipidemia 08/21/2019  . Neck pain 05/16/2018  . Nonischemic cardiomyopathy (HCC)   . 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 Scoliosis deformity of spine 07/17/2018    Past Surgical History:  Procedure Laterality Date  . CESAREAN SECTION    . RIGHT/LEFT HEART CATH AND CORONARY ANGIOGRAPHY N/A 06/11/2019   Procedure: RIGHT/LEFT HEART CATH AND CORONARY ANGIOGRAPHY;  Surgeon: Corky Crafts, MD;  Location: [facility] INVASIVE CV LAB;  Service: Cardiovascular;  Laterality: N/A;    Current Medications: Current Meds  Medication Sig  . amitriptyline (ELAVIL) 25 MG tablet Take 50 mg by mouth at bedtime.  Marland Kitchen atorvastatin (LIPITOR) 40 MG tablet Take 1 tablet (40 mg total) by mouth daily at 6 PM.   . carvedilol (COREG) 25 MG tablet Take 1 tablet (25 mg total) by mouth 2 (two) times daily with a meal.  . citalopram (CELEXA) 10 MG tablet Take 10 mg by mouth daily.  . dapagliflozin propanediol (FARXIGA) 10 MG TABS tablet Take 10 mg by mouth daily before breakfast.  . diphenhydramine-acetaminophen (TYLENOL PM) 25-500 MG TABS tablet Take 1 tablet by mouth at bedtime as needed (for pain).  . sacubitril-valsartan (ENTRESTO) 97-103 MG Take 1 tablet by mouth 2 (two) times daily.  Marland Kitchen spironolactone (ALDACTONE) 25 MG tablet Take 1 tablet (25 mg total) by mouth daily.     Allergies:   Latex   Social History   Socioeconomic History  . Marital status: Unknown    Spouse name: Not on file  . Number of children: Not on file  . Years of education: Not on file  . Highest education level: Not on file  Occupational History  . Not on file  Tobacco Use  . Smoking status: Former Games developer  . Smokeless tobacco: Never Used  Substance and Sexual Activity  . Alcohol use: Not Currently  . Drug use: Not on file  . Sexual activity: Not on file  Other Topics Concern  . Not on file  Social History Narrative  . Not on file   Social Determinants of Health   Financial Resource Strain: Not on file  Food Insecurity: Not on file  Transportation Needs: Not on file  Physical Activity: Not  on file  Stress: Not on file  Social Connections: Not on file     Family History: The patient's family history includes CAD in her father; Hypertension in her brother.  ROS:   Review of Systems  Constitution: Negative for decreased appetite, fever and weight gain.  HENT: Negative for congestion, ear discharge, hoarse voice and sore throat.   Eyes: Negative for discharge, redness, vision loss in right eye and visual halos.  Cardiovascular: Negative for chest pain, dyspnea on exertion, leg swelling, orthopnea and palpitations.  Respiratory: Negative for cough, hemoptysis, shortness of breath and snoring.   Endocrine:  Negative for heat intolerance and polyphagia.  Hematologic/Lymphatic: Negative for bleeding problem. Does not bruise/bleed easily.  Skin: Negative for flushing, nail changes, rash and suspicious lesions.  Musculoskeletal: Negative for arthritis, joint pain, muscle cramps, myalgias, neck pain and stiffness.  Gastrointestinal: Negative for abdominal pain, bowel incontinence, diarrhea and excessive appetite.  Genitourinary: Negative for decreased libido, genital sores and incomplete emptying.  Neurological: Negative for brief paralysis, focal weakness, headaches and loss of balance.  Psychiatric/Behavioral: Negative for altered mental status, depression and suicidal ideas.  Allergic/Immunologic: Negative for HIV exposure and persistent infections.    EKGs/Labs/Other Studies Reviewed:    The following studies were reviewed today:   EKG:  None today  TTE IMPRESSIONS  1. Left ventricular ejection fraction, by estimation, is 40 to 45%. The left ventricle has mildly decreased function. The left ventricle demonstrates global hypokinesis. Left ventricular diastolic parameters are consistent with Grade I diastolic  dysfunction (impaired relaxation).  2. Right ventricular systolic function is normal. The right ventricular size is normal.  3. The mitral valve is normal in structure. Trivial mitral valve regurgitation.  4. The aortic valve is normal in structure. Aortic valve regurgitation is not visualized. 5. The inferior vena cava is normal in size with greater than 50% respiratory variability, suggesting right atrial pressure of 3 mmHg.   FINDINGS  Left Ventricle: Left ventricular ejection fraction, by estimation, is 40 to 45%. The left ventricle has mildly decreased function. The left ventricle demonstrates global hypokinesis. The left ventricular internal  cavity size was normal in size. There is no left ventricular hypertrophy. Left ventricular diastolic parameters are consistent with  Grade I diastolic dysfunction (impaired relaxation).   Right Ventricle: The right ventricular size is normal. No increase in right ventricular wall thickness. Right ventricular systolic function is normal.   Left Atrium: Left atrial size was normal in size.   Right Atrium: Right atrial size was normal in size.   Pericardium: There is no evidence of pericardial effusion.   Mitral Valve: The mitral valve is normal in structure. Trivial mitral valve regurgitation.   Tricuspid Valve: The tricuspid valve is normal in structure. Tricuspid valve regurgitation is trivial.   Aortic Valve: The aortic valve is normal in structure. Aortic valve regurgitation is not visualized.   Pulmonic Valve: The pulmonic valve was grossly normal. Pulmonic valve regurgitation is trivial.   Aorta: The aortic root and ascending aorta are structurally normal, with no evidence of dilitation.   Venous: The inferior vena cava is normal in size with greater than 50% respiratory variability, suggesting right atrial pressure of 3 mmHg.   IAS/Shunts: No atrial level shunt detected by color flow Doppler.     Recent Labs: 10/20/2019: TSH 0.702 11/11/2019: Hemoglobin 14.9; Platelets 235 01/04/2020: Magnesium 2.0 05/16/2020: B Natriuretic Peptide 18.2; BUN 13; Creatinine, Ser 0.77; Potassium 4.1; Sodium 139  Recent Lipid Panel No results found for: CHOL,  TRIG, HDL, CHOLHDL, VLDL, LDLCALC, LDLDIRECT  Physical Exam:    VS:  BP 118/80   Pulse 86   Ht 5\' 6"  (1.676 m)   Wt 194 lb 12.8 oz (88.4 kg)   LMP  (LMP Unknown)   SpO2 96%   BMI 31.44 kg/m     Wt Readings from Last 3 Encounters:  08/16/20 194 lb 12.8 oz (88.4 kg)  05/16/20 187 lb 11.2 oz (85.1 kg)  04/11/20 182 lb 9.6 oz (82.8 kg)     GEN: Well nourished, well developed in no acute distress HEENT: Normal NECK: No JVD; No carotid bruits LYMPHATICS: No lymphadenopathy CARDIAC: S1S2 noted,RRR, no murmurs, rubs, gallops RESPIRATORY:  Clear to auscultation  without rales, wheezing or rhonchi  ABDOMEN: Soft, non-tender, non-distended, +bowel sounds, no guarding. EXTREMITIES: No edema, No cyanosis, no clubbing MUSCULOSKELETAL:  No deformity  SKIN: Warm and dry NEUROLOGIC:  Alert and oriented x 3, non-focal PSYCHIATRIC:  Normal affect, good insight  ASSESSMENT:    1. HFrEF (heart failure with reduced ejection fraction) (HCC)   2. Heart failure with reduced ejection fraction (HCC)   3. Nonischemic cardiomyopathy (HCC)   4. Mixed hyperlipidemia   5. Obesity (BMI 30-39.9)    PLAN:    She appears to be doing well from a cardiovascular standpoint.  I am very happy for the patient.  She is also happy with our heart failure team.  We will keep her on her current dose of carvedilol 25 mg twice a day, Farxiga 10 milligrams daily, Entresto 97-103 mg twice a day and Aldactone 25 mg a day.  I am hoping that her EF continues to improve.  I like to get blood work with hemoglobin A1c today but she is going to be following up with heart failure and actually get blood work.  She has gained some weight and she is concerned about this.  She would like to get some help I am going to have the patient see our medical weight management group.  The patient is in agreement with the above plan. The patient left the office in stable condition.  The patient will follow up in 6 months or sooner if needed.   Medication Adjustments/Labs and Tests Ordered: Current medicines are reviewed at length with the patient today.  Concerns regarding medicines are outlined above.  Orders Placed This Encounter  Procedures  . Amb Ref to Medical Weight Management   No orders of the defined types were placed in this encounter.   Patient Instructions  Medication Instructions:  Your physician recommends that you continue on your current medications as directed. Please refer to the Current Medication list given to you today.  *If you need a refill on your cardiac medications before  your next appointment, please call your pharmacy*   Lab Work: None If you have labs (blood work) drawn today and your tests are completely normal, you will receive your results only by: 04/13/20 MyChart Message (if you have MyChart) OR . A paper copy in the mail If you have any lab test that is abnormal or we need to change your treatment, we will call you to review the results.   Testing/Procedures: None   Follow-Up: At [facility], you and your health needs are our priority.  As part of our continuing mission to provide you with exceptional heart care, we have created designated Provider Care Teams.  These Care Teams include your primary Cardiologist (physician) and Advanced Practice Providers (APPs -  Physician Assistants and  Nurse Practitioners) who all work together to provide you with the care you need, when you need it.  We recommend signing up for the patient portal called "MyChart".  Sign up information is provided on this After Visit Summary.  MyChart is used to connect with patients for Virtual Visits (Telemedicine).  Patients are able to view lab/test results, encounter notes, upcoming appointments, etc.  Non-urgent messages can be sent to your provider as well.   To learn more about what you can do with MyChart, go to [email or web address].    Your next appointment:   6 month(s)  The format for your next appointment:   In Person  Provider:   Thomasene Ripple, DO   Other Instructions      Adopting a Healthy Lifestyle.  Know what a healthy weight is for you (roughly BMI <25) and aim to maintain this   Aim for 7+ servings of fruits and vegetables daily   65-80+ fluid ounces of water or unsweet tea for healthy kidneys   Limit to max 1 drink of alcohol per day; avoid smoking/tobacco   Limit animal fats in diet for cholesterol and heart health - choose grass fed whenever available   Avoid highly processed foods, and foods high in saturated/trans fats   Aim for low  stress - take time to unwind and care for your mental health   Aim for 150 min of moderate intensity exercise weekly for heart health, and weights twice weekly for bone health   Aim for 7-9 hours of sleep daily   When it comes to diets, agreement about the perfect plan isnt easy to find, even among the experts. Experts at the [facility] [employer] developed an idea known as the Healthy Eating Plate. Just imagine a plate divided into logical, healthy portions.   The emphasis is on diet quality:   Load up on vegetables and fruits - one-half of your plate: Aim for color and variety, and remember that potatoes dont count.   Go for whole grains - one-quarter of your plate: Whole wheat, barley, wheat berries, quinoa, oats, brown rice, and foods made with them. If you want pasta, go with whole wheat pasta.   Protein power - one-quarter of your plate: Fish, chicken, beans, and nuts are all healthy, versatile protein sources. Limit red meat.   The diet, however, does go beyond the plate, offering a few other suggestions.   Use healthy plant oils, such as olive, canola, soy, corn, sunflower and peanut. Check the labels, and avoid partially hydrogenated oil, which have unhealthy trans fats.   If youre thirsty, drink water. Coffee and tea are good in moderation, but skip sugary drinks and limit milk and dairy products to one or two daily servings.   The type of carbohydrate in the diet is more important than the amount. Some sources of carbohydrates, such as vegetables, fruits, whole grains, and beans-are healthier than others.   Finally, stay active  Signed, Thomasene Ripple, DO  08/16/2020 3:48 PM    [facility]

## 2020-08-18 ENCOUNTER — Ambulatory Visit (HOSPITAL_COMMUNITY)
Admission: RE | Admit: 2020-08-18 | Discharge: 2020-08-18 | Disposition: A | Discharge status: Home / Self Care | Payer: Self-pay | Source: Ambulatory Visit | Attending: Family Medicine | Admitting: Family Medicine

## 2020-08-18 ENCOUNTER — Encounter (HOSPITAL_COMMUNITY): Payer: Self-pay

## 2020-08-18 ENCOUNTER — Other Ambulatory Visit: Payer: Self-pay

## 2020-08-18 VITALS — BP 130/90 | HR 78 | Wt 196.8 lb

## 2020-08-18 DIAGNOSIS — Z6831 Body mass index (BMI) 31.0-31.9, adult: Secondary | ICD-10-CM | POA: Insufficient documentation

## 2020-08-18 DIAGNOSIS — Z7182 Exercise counseling: Secondary | ICD-10-CM | POA: Insufficient documentation

## 2020-08-18 DIAGNOSIS — I10 Essential (primary) hypertension: Secondary | ICD-10-CM

## 2020-08-18 DIAGNOSIS — Z79899 Other long term (current) drug therapy: Secondary | ICD-10-CM | POA: Insufficient documentation

## 2020-08-18 DIAGNOSIS — Z87891 Personal history of nicotine dependence: Secondary | ICD-10-CM | POA: Insufficient documentation

## 2020-08-18 DIAGNOSIS — I5022 Chronic systolic (congestive) heart failure: Secondary | ICD-10-CM | POA: Insufficient documentation

## 2020-08-18 DIAGNOSIS — I428 Other cardiomyopathies: Secondary | ICD-10-CM | POA: Insufficient documentation

## 2020-08-18 DIAGNOSIS — Z7901 Long term (current) use of anticoagulants: Secondary | ICD-10-CM | POA: Insufficient documentation

## 2020-08-18 DIAGNOSIS — E669 Obesity, unspecified: Secondary | ICD-10-CM | POA: Insufficient documentation

## 2020-08-18 DIAGNOSIS — R0602 Shortness of breath: Secondary | ICD-10-CM | POA: Insufficient documentation

## 2020-08-18 DIAGNOSIS — Z8249 Family history of ischemic heart disease and other diseases of the circulatory system: Secondary | ICD-10-CM | POA: Insufficient documentation

## 2020-08-18 DIAGNOSIS — E785 Hyperlipidemia, unspecified: Secondary | ICD-10-CM | POA: Insufficient documentation

## 2020-08-18 DIAGNOSIS — I110 Hypertensive heart disease with heart failure: Secondary | ICD-10-CM | POA: Insufficient documentation

## 2020-08-18 DIAGNOSIS — I5020 Unspecified systolic (congestive) heart failure: Secondary | ICD-10-CM

## 2020-08-18 DIAGNOSIS — M549 Dorsalgia, unspecified: Secondary | ICD-10-CM | POA: Insufficient documentation

## 2020-08-18 DIAGNOSIS — G8929 Other chronic pain: Secondary | ICD-10-CM | POA: Insufficient documentation

## 2020-08-18 LAB — HEMOGLOBIN A1C
Hgb A1c MFr Bld: 5.9 % — ABNORMAL HIGH (ref 4.8–5.6)
Mean Plasma Glucose: 122.63 mg/dL

## 2020-08-18 LAB — BASIC METABOLIC PANEL
Anion gap: 9 (ref 5–15)
BUN: 13 mg/dL (ref 6–20)
CO2: 27 mmol/L (ref 22–32)
Calcium: 9.6 mg/dL (ref 8.9–10.3)
Chloride: 104 mmol/L (ref 98–111)
Creatinine, Ser: 0.69 mg/dL (ref 0.44–1.00)
GFR, Estimated: >60 mL/min (ref >60)
Glucose, Bld: 101 mg/dL — ABNORMAL HIGH (ref 70–99)
Potassium: 4.6 mmol/L (ref 3.5–5.1)
Sodium: 140 mmol/L (ref 135–145)

## 2020-08-18 NOTE — Progress Notes (Signed)
ADVANCED HF CLINIC NOTE  Primary Care: Dr. Luna Kitchens [facility])  Primary Cardiologist: Dr. [name] [facility]: Dr. Gala Romney   Reason for Visit: f/u for chronic systolic heart failure, med titration   HPI: Krista Jenkins a 60 y.o.femalewith a HL, chronic back pain and systolic HF due to NICM.  In November was coughing and went to PCP with cough and SOB. Diagnosed with bronchitis/PNA. No f/c. Was sob with bad cough. Presented to [facility] in 12/20 with new onset HF. EF 20-25% on echo. Transferred to [facility] for cath. Cath with EF < 25% and normal cors. Pulmonary capillary wedge pressure was 15 mmHg and a mean pulmonary pressure was 18 mmHg,cardiac output 4.9 L with a cardiac index of 2.5.   Today she returns for HF follow up. Last HF clinic visit 11/21 she was feeling well, echo improved (EF 40-45%) & dig and LifeVest stopped. Now, overall feeling fine. SOB with carrying groceries upstairs. Denies increasing SOB, CP, dizziness, edema, or PND/Orthopnea. Appetite ok. No fever or chills. Taking all medications. Doing CR 3x/week, tolerating well. She has gained some weight and is concerned about this.  Cardiac Studies: cMRI (1/21): EF 16%, no LGE, mild MR. Normal RV Echo (10/01/19): EF < 20% normal RV.  Echo (01/21/20): EF slightly improved 25% RV normal. Echo (11/21): EF 40-45% RV ok.  Past Medical History:  Diagnosis Date  . ACC/AHA stage C systolic heart failure (HCC) 08/21/2019  . Acute systolic CHF (congestive heart failure) (HCC) 06/10/2019  . Asthma 05/16/2018  . Cervical radiculopathy 06/27/2018  . Chronic back pain 05/16/2018  . Degeneration of lumbar intervertebral disc 06/27/2018  . Depressed left ventricular ejection fraction   . Heart failure with reduced ejection fraction (HCC)   . Hyperlipidemia   . Lumbar radiculopathy 06/28/2018  . Mixed hyperlipidemia 08/21/2019  . Neck pain 05/16/2018  . Nonischemic cardiomyopathy (HCC)   . NYHA class 3 heart failure with reduced  ejection fraction (HCC) 08/21/2019  . Scoliosis deformity of spine 07/17/2018    Current Outpatient Medications  Medication Sig Dispense Refill  . amitriptyline (ELAVIL) 25 MG tablet Take 50 mg by mouth at bedtime.    Marland Kitchen atorvastatin (LIPITOR) 40 MG tablet Take 1 tablet (40 mg total) by mouth daily at 6 PM. 90 tablet 2  . carvedilol (COREG) 25 MG tablet Take 1 tablet (25 mg total) by mouth 2 (two) times daily with a meal. 180 tablet 3  . citalopram (CELEXA) 10 MG tablet Take 10 mg by mouth daily.    . dapagliflozin propanediol (FARXIGA) 10 MG TABS tablet Take 10 mg by mouth daily before breakfast. 30 tablet 5  . diphenhydramine-acetaminophen (TYLENOL PM) 25-500 MG TABS tablet Take 1 tablet by mouth at bedtime as needed (for pain).    . sacubitril-valsartan (ENTRESTO) 97-103 MG Take 1 tablet by mouth 2 (two) times daily. 180 tablet 3  . spironolactone (ALDACTONE) 25 MG tablet Take 1 tablet (25 mg total) by mouth daily. 90 tablet 3   No current facility-administered medications for this encounter.   Allergies  Allergen Reactions  . Latex    Social History   Socioeconomic History  . Marital status: Unknown    Spouse name: Not on file  . Number of children: Not on file  . Years of education: Not on file  . Highest education level: Not on file  Occupational History  . Not on file  Tobacco Use  . Smoking status: Former Games developer  . Smokeless tobacco: Never Used  Substance and Sexual Activity  . Alcohol use: Not Currently  . Drug use: Not on file  . Sexual activity: Not on file  Other Topics Concern  . Not on file  Social History Narrative  . Not on file   Social Determinants of Health   Financial Resource Strain: Not on file  Food Insecurity: Not on file  Transportation Needs: Not on file  Physical Activity: Not on file  Stress: Not on file  Social Connections: Not on file  Intimate Partner Violence: Not on file   Family History  Problem Relation Age of Onset  . CAD Father    . Hypertension Brother    Vitals:   08/18/20 1422  BP: 130/90  Pulse: 78  SpO2: 99%  Weight: 89.3 kg (196 lb 12.8 oz)   Wt Readings from Last 3 Encounters:  08/18/20 89.3 kg (196 lb 12.8 oz)  08/16/20 88.4 kg (194 lb 12.8 oz)  05/16/20 85.1 kg (187 lb 11.2 oz)   PHYSICAL EXAM: General:  NAD. No resp difficulty HEENT: Normal Neck: Supple. No JVD. Carotids 2+ bilat; no bruits. No lymphadenopathy or thryomegaly appreciated. Cor: PMI nondisplaced. Regular rate & rhythm. No rubs, gallops or murmurs. Lungs: Clear Abdomen: Obese, soft, nontender, nondistended. No hepatosplenomegaly. No bruits or masses. Good bowel sounds. Extremities: No cyanosis, clubbing, rash, edema Neuro: alert & oriented x 3, cranial nerves grossly intact. Moves all 4 extremities w/o difficulty. Affect pleasant.  Reds: 29%  ASSESSMENT & PLAN:  1. 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 EF < 20% - cMRI 1/21 LVEF 16% with no LGE Mild MR. Normal RV - Echo (10/01/19): EF < 20% normal RV.  - Echo (01/21/20): EF 25%, RV normal.  - Echo (05/15/20): EF 40-45% RV normal.  - NYHA II, euvolemic on exam today. - Continue Entresto to 97/103 mg bid. - Continue carvedilol 25 mg bid. - Continue Farxiga 10 mg daily. - Continue spiro 25 mg daily. - Continue CR. - BMET today.  2. HTN - BP stable today. - Home ~130-140s in evenings, but sBP 90's in AM. - She would like to log BP more consistently at home for 3-4 weeks and report back before starting another BP medication. - Consider addition of amlodipine if BP>140.  3. HLD - Followed by general cardiology. - Continue atorvastatin.  4. Obesity Body mass index is 31.76 kg/m. - Check a1c - Referred to [facility] but her insurance will not cover visits. - Encouraged portion control and reduced carbohydrate intake, as well as increased physical activity. - Also instructed her to follow up with PCP; unsure if she will qualify, but  Saxenda or Contrave may be beneficial.  Follow back in 6 months with Dr. Gala Romney with echo.  Anderson Malta [place], FNP-BC 08/18/20

## 2020-08-18 NOTE — Patient Instructions (Signed)
It was great to see you today! No medication changes are needed at this time.  Labs today We will only contact you if something comes back abnormal or we need to make some changes. Otherwise no news is good news!  Your physician recommends that you schedule a follow-up appointment in: 6-8 months with Dr Gala Romney and echo -PLEASE GIVE OUR OFFICE A CALL TO SCHEDULE THESE APPOINTMENTS  Your physician has requested that you have an echocardiogram. Echocardiography is a painless test that uses sound waves to create images of your heart. It provides your doctor with information about the size and shape of your heart and how well your heart's chambers and valves are working. This procedure takes approximately one hour. There are no restrictions for this procedure.  At the [facility], you and your health needs are our priority. As part of our continuing mission to provide you with exceptional heart care, we have created designated Provider Care Teams. These Care Teams include your primary Cardiologist (physician) and Advanced Practice Providers (APPs- Physician Assistants and Nurse Practitioners) who all work together to provide you with the care you need, when you need it.   You may see any of the following providers on your designated Care Team at your next follow up: Marland Kitchen Dr Arvilla Meres . Dr Marca Ancona . Dr Thornell Mule . Tonye Becket, NP . Robbie Lis, PA . Shanda Bumps [place],NP . Karle Plumber, PharmD   Please be sure to bring in all your medications bottles to every appointment.    If you have any questions or concerns before your next appointment please send [place] a message through [place] or call our office at [phone].    TO LEAVE A MESSAGE FOR THE NURSE SELECT OPTION 2, PLEASE LEAVE A MESSAGE INCLUDING: . YOUR NAME . DATE OF BIRTH . CALL BACK NUMBER . REASON FOR CALL**this is important as we prioritize the call backs  YOU WILL RECEIVE A CALL BACK THE  SAME DAY AS LONG AS YOU CALL BEFORE 4:00 PM

## 2020-08-18 NOTE — Progress Notes (Signed)
ReDS Vest / Clip - 08/18/20 1500      ReDS Vest / Clip   Station Marker C    Ruler Value 31    ReDS Value Range Low volume    ReDS Actual Value 29

## 2020-08-24 ENCOUNTER — Telehealth: Payer: Self-pay | Admitting: Cardiology

## 2020-08-24 NOTE — Telephone Encounter (Signed)
Follow up:    Patient calling to check on the status of her paper concering Enstrto. She brought them to office last week and states the nurse was going to resend them. The patient did not get the name of the nurse.

## 2020-08-25 NOTE — Telephone Encounter (Signed)
Follow Up:      Pt is calling to find out the status of her Entresto's application?

## 2020-08-29 ENCOUNTER — Telehealth: Payer: Self-pay | Admitting: Cardiology

## 2020-08-29 NOTE — Telephone Encounter (Signed)
Pt states she called financial assistance for Entresto and they still haven't received anything from our office. She would like to be called about the progress on this.   [phone]  Thanks!  Domingo Dimes

## 2020-08-30 NOTE — Telephone Encounter (Signed)
Called patient assistance foundation. She states she has not received the application at this time, she gave me a different fax number to resend the application to.

## 2020-08-30 NOTE — Telephone Encounter (Signed)
Spoke with patient about the paperwork. I will reach out to the assistance program and see what the status of the application is at this time. Will reach back out to patient when I have more information.

## 2020-09-02 NOTE — Telephone Encounter (Signed)
I have checked into this. Will call and update patient.

## 2020-09-07 ENCOUNTER — Telehealth: Payer: Self-pay | Admitting: Cardiology

## 2020-09-07 NOTE — Telephone Encounter (Signed)
Pt came in to fill out Entresto paperwork to be resent to [employer]. Patient states she also needs to fill out paperwork for an assistance program to help with [place]. She is under the impression she is already part of one but needs to re-enroll. Please advise.    Thanks!

## 2020-09-08 NOTE — Telephone Encounter (Signed)
Pt came in yesterday (09/07/20) and dropped off her part of [employer] application. Per [facility] Dr. [name] doesn't prescribe medication for Nov[employer]is app [name])- Pt was told to take to Dr. Gala Romney, (medication prescriber). Pt understood.  An application from [employer] came through this morning via fax for Krista Jenkins' prescription Farxiga- Dr. [name] does prescribe this and will fill this out. Krista Jenkins filled out her portion when she stopped by today 09/08/20.

## 2020-09-08 NOTE — Telephone Encounter (Signed)
Paperwork addressed with patient when she was in the office.

## 2020-09-13 ENCOUNTER — Telehealth (HOSPITAL_COMMUNITY): Payer: Self-pay | Admitting: Pharmacy Technician

## 2020-09-13 NOTE — Telephone Encounter (Signed)
Sent in Nov[employer]is application via fax.  Will follow up.  

## 2020-09-15 NOTE — Telephone Encounter (Signed)
Received a fax stating patient was approved for Entresto assistance until 09-14-21.

## 2020-11-03 ENCOUNTER — Telehealth: Payer: Self-pay | Admitting: Cardiology

## 2020-11-03 NOTE — Telephone Encounter (Signed)
Spoke with pt who states that she has been having pain in her right leg for approximately 1 week. Pt states that it is increased with walking. Pt states that Tylenol does not help the pain. She does report mild swelling below the knees on both legs. She denies any temperature or color change in her legs. How do you advise?

## 2020-11-03 NOTE — Telephone Encounter (Signed)
Recommendations reviewed with pt as per Dr. Tobb's note.  Pt verbalized understanding and had no additional questions.  

## 2020-11-03 NOTE — Telephone Encounter (Signed)
Please ask her to see her pcp for sooner evaluation.

## 2020-11-03 NOTE — Telephone Encounter (Signed)
    Pt said she's been having pain on her right leg, from her thigh to her foot. She said some days it is so painful she can hardly walk, she said she's been feeling this for a week and a half now, no swelling. She wanted to know if this related to her heart failure

## 2020-11-25 ENCOUNTER — Encounter (HOSPITAL_COMMUNITY): Payer: Self-pay

## 2020-11-30 ENCOUNTER — Other Ambulatory Visit: Payer: Self-pay

## 2020-11-30 ENCOUNTER — Ambulatory Visit (HOSPITAL_COMMUNITY)
Admission: RE | Admit: 2020-11-30 | Discharge: 2020-11-30 | Disposition: A | Discharge status: Home / Self Care | Payer: Medicaid Other | Source: Ambulatory Visit | Attending: Cardiology | Admitting: Cardiology

## 2020-11-30 DIAGNOSIS — I5020 Unspecified systolic (congestive) heart failure: Secondary | ICD-10-CM | POA: Insufficient documentation

## 2020-11-30 LAB — BASIC METABOLIC PANEL
Anion gap: 6 (ref 5–15)
BUN: 19 mg/dL (ref 6–20)
CO2: 30 mmol/L (ref 22–32)
Calcium: 9.5 mg/dL (ref 8.9–10.3)
Chloride: 101 mmol/L (ref 98–111)
Creatinine, Ser: 0.86 mg/dL (ref 0.44–1.00)
GFR, Estimated: >60 mL/min (ref >60)
Glucose, Bld: 65 mg/dL — ABNORMAL LOW (ref 70–99)
Potassium: 4.1 mmol/L (ref 3.5–5.1)
Sodium: 137 mmol/L (ref 135–145)

## 2020-11-30 LAB — DIGOXIN LEVEL: Digoxin Level: <0.2 ng/mL — ABNORMAL LOW (ref 0.8–2.0)

## 2020-12-22 ENCOUNTER — Other Ambulatory Visit: Payer: Self-pay | Admitting: Cardiology

## 2021-02-14 ENCOUNTER — Other Ambulatory Visit: Payer: Self-pay

## 2021-02-14 ENCOUNTER — Ambulatory Visit (HOSPITAL_BASED_OUTPATIENT_CLINIC_OR_DEPARTMENT_OTHER)
Admission: RE | Admit: 2021-02-14 | Discharge: 2021-02-14 | Disposition: A | Discharge status: Home / Self Care | Payer: Self-pay | Source: Ambulatory Visit | Attending: Internal Medicine | Admitting: Internal Medicine

## 2021-02-14 ENCOUNTER — Encounter (HOSPITAL_COMMUNITY): Payer: Self-pay | Admitting: Internal Medicine

## 2021-02-14 ENCOUNTER — Ambulatory Visit (HOSPITAL_COMMUNITY)
Admission: RE | Admit: 2021-02-14 | Discharge: 2021-02-14 | Disposition: A | Discharge status: Home / Self Care | Payer: Self-pay | Source: Ambulatory Visit | Attending: Family Medicine | Admitting: Family Medicine

## 2021-02-14 VITALS — BP 120/80 | HR 83 | Wt 191.0 lb

## 2021-02-14 DIAGNOSIS — G8929 Other chronic pain: Secondary | ICD-10-CM | POA: Insufficient documentation

## 2021-02-14 DIAGNOSIS — E669 Obesity, unspecified: Secondary | ICD-10-CM

## 2021-02-14 DIAGNOSIS — I5020 Unspecified systolic (congestive) heart failure: Secondary | ICD-10-CM | POA: Insufficient documentation

## 2021-02-14 DIAGNOSIS — Z7984 Long term (current) use of oral hypoglycemic drugs: Secondary | ICD-10-CM | POA: Insufficient documentation

## 2021-02-14 DIAGNOSIS — I10 Essential (primary) hypertension: Secondary | ICD-10-CM

## 2021-02-14 DIAGNOSIS — I5022 Chronic systolic (congestive) heart failure: Secondary | ICD-10-CM

## 2021-02-14 DIAGNOSIS — Z7901 Long term (current) use of anticoagulants: Secondary | ICD-10-CM | POA: Insufficient documentation

## 2021-02-14 DIAGNOSIS — I428 Other cardiomyopathies: Secondary | ICD-10-CM | POA: Insufficient documentation

## 2021-02-14 DIAGNOSIS — Z8249 Family history of ischemic heart disease and other diseases of the circulatory system: Secondary | ICD-10-CM | POA: Insufficient documentation

## 2021-02-14 DIAGNOSIS — M79605 Pain in left leg: Secondary | ICD-10-CM | POA: Insufficient documentation

## 2021-02-14 DIAGNOSIS — M549 Dorsalgia, unspecified: Secondary | ICD-10-CM | POA: Insufficient documentation

## 2021-02-14 DIAGNOSIS — E782 Mixed hyperlipidemia: Secondary | ICD-10-CM | POA: Insufficient documentation

## 2021-02-14 DIAGNOSIS — Z79899 Other long term (current) drug therapy: Secondary | ICD-10-CM | POA: Insufficient documentation

## 2021-02-14 DIAGNOSIS — I110 Hypertensive heart disease with heart failure: Secondary | ICD-10-CM | POA: Insufficient documentation

## 2021-02-14 DIAGNOSIS — Z87891 Personal history of nicotine dependence: Secondary | ICD-10-CM | POA: Insufficient documentation

## 2021-02-14 DIAGNOSIS — Z09 Encounter for follow-up examination after completed treatment for conditions other than malignant neoplasm: Secondary | ICD-10-CM | POA: Insufficient documentation

## 2021-02-14 LAB — ECHOCARDIOGRAM COMPLETE
Area-P 1/2: 2.84 cm2
Calc EF: 44.2 %
S' Lateral: 3.3 cm
Single Plane A2C EF: 49.6 %
Single Plane A4C EF: 40 %

## 2021-02-14 NOTE — Progress Notes (Signed)
ADVANCED HF CLINIC NOTE  Primary Care: Dr. Luna Kitchens [facility])  Primary Cardiologist: Dr. [name] [facility]: Dr. Gala Romney   Reason for Visit: f/u for chronic systolic heart failure  HPI: Krista Jenkins is a 60 y.o. female with a HL, chronic back pain and systolic HF due to NICM.  Presented to [facility] in 12/20 with new onset HF. EF 20-25% on echo. Transferred to [facility] for cath. Cath with EF < 25% and normal cors. Pulmonary capillary wedge pressure was 15 mmHg and a mean pulmonary pressure was 18 mmHg, cardiac output 4.9 L with a cardiac index of 2.5.   Echo 11/21 EF 40-45%  Today she returns for HF follow up. Doing ok. Only complaint has been pain in left leg in the am. Was in the Maintenance Program at [facility] but hasn't been going this summer. Remains very active around the house without too much difficulty. No edema, orthopnea or PND. Recently Entresto dropped from 97/103 bid to 49/51 bid due to lw BP.   Cardiac Studies: cMRI (1/21): EF 16%, no LGE, mild MR. Normal RV Echo (10/01/19): EF < 20% normal RV.  Echo (01/21/20): EF slightly improved 25% RV normal. Echo (11/21): EF 40-45% RV ok.  Past Medical History:  Diagnosis Date   ACC/AHA stage C systolic heart failure (HCC) 08/21/2019   Acute systolic CHF (congestive heart failure) (HCC) 06/10/2019   Asthma 05/16/2018   Cervical radiculopathy 06/27/2018   Chronic back pain 05/16/2018   Degeneration of lumbar intervertebral disc 06/27/2018   Depressed left ventricular ejection fraction    Heart failure with reduced ejection fraction (HCC)    Hyperlipidemia    Lumbar radiculopathy 06/28/2018   Mixed hyperlipidemia 08/21/2019   Neck pain 05/16/2018   Nonischemic cardiomyopathy (HCC)    NYHA class 3 heart failure with reduced ejection fraction (HCC) 08/21/2019   Scoliosis deformity of spine 07/17/2018    Current Outpatient Medications  Medication Sig Dispense Refill   amitriptyline (ELAVIL) 25 MG tablet Take 50 mg by mouth at bedtime.      atorvastatin (LIPITOR) 40 MG tablet TAKE 1 TABLET BY MOUTH DAILY AT 6PM 90 tablet 1   carvedilol (COREG) 25 MG tablet Take 1 tablet (25 mg total) by mouth 2 (two) times daily with a meal. 180 tablet 3   citalopram (CELEXA) 10 MG tablet Take 10 mg by mouth daily.     dapagliflozin propanediol (FARXIGA) 10 MG TABS tablet Take 10 mg by mouth daily before breakfast. 30 tablet 5   diphenhydramine-acetaminophen (TYLENOL PM) 25-500 MG TABS tablet Take 1 tablet by mouth at bedtime as needed (for pain).     sacubitril-valsartan (ENTRESTO) 49-51 MG Take 1 tablet by mouth 2 (two) times daily.     spironolactone (ALDACTONE) 25 MG tablet Take 1 tablet (25 mg total) by mouth daily. 90 tablet 3   No current facility-administered medications for this encounter.   Allergies  Allergen Reactions   Latex    Social History   Socioeconomic History   Marital status: Unknown    Spouse name: Not on file   Number of children: Not on file   Years of education: Not on file   Highest education level: Not on file  Occupational History   Not on file  Tobacco Use   Smoking status: Former   Smokeless tobacco: Never  Substance and Sexual Activity   Alcohol use: Not Currently   Drug use: Not on file   Sexual activity: Not on file  Other  Topics Concern   Not on file  Social History Narrative   Not on file   Social Determinants of Health   Financial Resource Strain: Not on file  Food Insecurity: Not on file  Transportation Needs: Not on file  Physical Activity: Not on file  Stress: Not on file  Social Connections: Not on file  Intimate Partner Violence: Not on file   Family History  Problem Relation Age of Onset   CAD Father    Hypertension Brother    Vitals:   02/14/21 1427  BP: 120/80  Pulse: 83  SpO2: 98%  Weight: 86.6 kg (191 lb)    Wt Readings from Last 3 Encounters:  02/14/21 86.6 kg (191 lb)  08/18/20 89.3 kg (196 lb 12.8 oz)  08/16/20 88.4 kg (194 lb 12.8 oz)   PHYSICAL  EXAM: General:  Well appearing. No resp difficulty HEENT: normal Neck: supple. no JVD. Carotids 2+ bilat; no bruits. No lymphadenopathy or thryomegaly appreciated. Cor: PMI nondisplaced. Regular rate & rhythm. No rubs, gallops or murmurs. Lungs: clear Abdomen: soft, nontender, nondistended. No hepatosplenomegaly. No bruits or masses. Good bowel sounds. Extremities: no cyanosis, clubbing, rash, edema Neuro: alert & orientedx3, cranial nerves grossly intact. moves all 4 extremities w/o difficulty. Affect pleasant   ASSESSMENT & PLAN:  1. 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 EF < 20% - cMRI 1/21 LVEF 16% with no LGE Mild MR. Normal RV - Echo (10/01/19): EF < 20% normal RV.  - Echo (01/21/20): EF 25%, RV normal.  - Echo (05/15/20): EF 40-45% RV normal.  - Echo 02/14/21: EF 40-45% RV ok  - Stable NYHA II, volume status ok  - Continue Entresto to 49/51 bid (recently decreased due to low BP) - Continue carvedilol 25 mg bid. - Continue Farxiga 10 mg daily. - Continue spiro 25 mg daily. - f/u 6 months   2. HTN - BP looks good.  [name] recently decreased due to low BP in setting of weight loss  3. HLD - Followed by general cardiology. - Continue atorvastatin.  4. Obesity - continue weight loss efforts  Arvilla Meres, MD 02/14/21

## 2021-02-14 NOTE — Patient Instructions (Signed)
Please call our office in January to schedule your follow up appointment  If you have any questions or concerns before your next appointment please send [place] a message through [place] or call our office at [phone].    TO LEAVE A MESSAGE FOR THE NURSE SELECT OPTION 2, PLEASE LEAVE A MESSAGE INCLUDING: YOUR NAME DATE OF BIRTH CALL BACK NUMBER REASON FOR CALL**this is important as we prioritize the call backs  YOU WILL RECEIVE A CALL BACK THE SAME DAY AS LONG AS YOU CALL BEFORE 4:00 PM

## 2021-02-14 NOTE — Progress Notes (Signed)
  Echocardiogram 2D Echocardiogram has been performed.  Augustine Radar 02/14/2021, 1:52 PM

## 2021-02-14 NOTE — Addendum Note (Signed)
Encounter addended by: Noralee Space, RN on: 02/14/2021 2:57 PM  Actions taken: Clinical Note Signed

## 2021-02-22 ENCOUNTER — Ambulatory Visit: Payer: Self-pay | Admitting: Cardiology

## 2021-03-16 ENCOUNTER — Ambulatory Visit: Payer: Self-pay | Admitting: Cardiology

## 2021-03-24 ENCOUNTER — Other Ambulatory Visit (HOSPITAL_BASED_OUTPATIENT_CLINIC_OR_DEPARTMENT_OTHER): Payer: Self-pay | Admitting: Orthopaedic Surgery

## 2021-03-24 ENCOUNTER — Ambulatory Visit (INDEPENDENT_AMBULATORY_CARE_PROVIDER_SITE_OTHER): Payer: Self-pay | Admitting: Orthopaedic Surgery

## 2021-03-24 ENCOUNTER — Ambulatory Visit (HOSPITAL_BASED_OUTPATIENT_CLINIC_OR_DEPARTMENT_OTHER)
Admission: RE | Admit: 2021-03-24 | Discharge: 2021-03-24 | Disposition: A | Discharge status: Home / Self Care | Payer: Medicaid Other | Source: Ambulatory Visit | Attending: Orthopaedic Surgery | Admitting: Orthopaedic Surgery

## 2021-03-24 ENCOUNTER — Other Ambulatory Visit: Payer: Self-pay

## 2021-03-24 VITALS — BP 102/68 | Ht 66.0 in | Wt 191.0 lb

## 2021-03-24 DIAGNOSIS — M25562 Pain in left knee: Secondary | ICD-10-CM

## 2021-03-24 DIAGNOSIS — G8929 Other chronic pain: Secondary | ICD-10-CM

## 2021-03-24 DIAGNOSIS — M1712 Unilateral primary osteoarthritis, left knee: Secondary | ICD-10-CM

## 2021-03-24 NOTE — Progress Notes (Signed)
Chief Complaint: left knee pain     History of Present Illness:   Pain Score: 5/10 SANE: 50/100  Krista Jenkins is a 60 y.o. female with left knee pain now going on several months.  This has been recently worse in the last few weeks.  She states that she is experiencing swelling in the knee.  She hurts first thing in the morning when she gets out of bed.  She says that the knee pain is aching in nature.  She was diagnosed with heart failure and has increased swelling on the side.  She has pain with bending the knee.  She did have a cortisone injection on August 25 that was done by her primary care doctor this did not help at all.  She has been taking Tylenol which is not helping    Surgical History:   None  PMH/PSH/Family History/Social History/Meds/Allergies:    Past Medical History:  Diagnosis Date   ACC/AHA stage C systolic heart failure (HCC) 08/21/2019   Acute systolic CHF (congestive heart failure) (HCC) 06/10/2019   Asthma 05/16/2018   Cervical radiculopathy 06/27/2018   Chronic back pain 05/16/2018   Degeneration of lumbar intervertebral disc 06/27/2018   Depressed left ventricular ejection fraction    Heart failure with reduced ejection fraction (HCC)    Hyperlipidemia    Lumbar radiculopathy 06/28/2018   Mixed hyperlipidemia 08/21/2019   Neck pain 05/16/2018   Nonischemic cardiomyopathy (HCC)    NYHA class 3 heart failure with reduced ejection fraction (HCC) 08/21/2019   Scoliosis deformity of spine 07/17/2018   Past Surgical History:  Procedure Laterality Date   CESAREAN SECTION     RIGHT/LEFT HEART CATH AND CORONARY ANGIOGRAPHY N/A 06/11/2019   Procedure: RIGHT/LEFT HEART CATH AND CORONARY ANGIOGRAPHY;  Surgeon: Corky Crafts, MD;  Location: [facility] INVASIVE CV LAB;  Service: Cardiovascular;  Laterality: N/A;   Social History   Socioeconomic History   Marital status: Unknown    Spouse name: Not on file   Number of children: Not on  file   Years of education: Not on file   Highest education level: Not on file  Occupational History   Not on file  Tobacco Use   Smoking status: Former   Smokeless tobacco: Never  Substance and Sexual Activity   Alcohol use: Not Currently   Drug use: Not on file   Sexual activity: Not on file  Other Topics Concern   Not on file  Social History Narrative   Not on file   Social Determinants of Health   Financial Resource Strain: Not on file  Food Insecurity: Not on file  Transportation Needs: Not on file  Physical Activity: Not on file  Stress: Not on file  Social Connections: Not on file   Family History  Problem Relation Age of Onset   CAD Father    Hypertension Brother    Allergies  Allergen Reactions   Latex    Current Outpatient Medications  Medication Sig Dispense Refill   amitriptyline (ELAVIL) 25 MG tablet Take 50 mg by mouth at bedtime.     atorvastatin (LIPITOR) 40 MG tablet TAKE 1 TABLET BY MOUTH DAILY AT 6PM 90 tablet 1   carvedilol (COREG) 25 MG tablet Take 1 tablet (25 mg total) by mouth 2 (two) times daily with a meal. 180  tablet 3   citalopram (CELEXA) 10 MG tablet Take 10 mg by mouth daily.     dapagliflozin propanediol (FARXIGA) 10 MG TABS tablet Take 10 mg by mouth daily before breakfast. 30 tablet 5   diphenhydramine-acetaminophen (TYLENOL PM) 25-500 MG TABS tablet Take 1 tablet by mouth at bedtime as needed (for pain).     sacubitril-valsartan (ENTRESTO) 49-51 MG Take 1 tablet by mouth 2 (two) times daily.     spironolactone (ALDACTONE) 25 MG tablet Take 1 tablet (25 mg total) by mouth daily. 90 tablet 3   No current facility-administered medications for this visit.   No results found.  Review of Systems:   A ROS was performed including pertinent positives and negatives as documented in the HPI.  Physical Exam :   Constitutional: NAD and appears stated age Neurological: Alert and oriente Psych: Appropriate affect and cooperative Blood  pressure 102/68, height 5\' 6"  (1.676 m), weight 191 lb (86.6 kg).   Comprehensive Musculoskeletal Exam:      Musculoskeletal Exam  Gait Normal  Alignment Normal   Right Left  Inspection Normal Normal  Palpation    Tenderness none Medial, lateral, PF joint  Crepitus None positive  Effusion none mild  Range of Motion    Extension 0 0  Flexion 135 135  Strength    Extension 5/5 5/5  Flexion 5/5 5/5  Ligament Exam     Generalized Laxity No No  Lachman Negative Negative   Pivot Shift Negative Negative  Anterior Drawer Negative Negative  Valgus at 0 Negative Negative  Valgus at 20 Negative Negative  Varus at 0 0 0  Varus at 20   0 0  Posterior Drawer at 90 0 0  Vascular/Lymphatic Exam    Edema None None  Venous Stasis Changes No No  Distal Circulation Normal Normal  Neurologic    Light Touch Sensation Intact Intact  Special Tests:      Imaging:   Xray (4 views left knee): Tricompartmental moderate osteoarthritis  I personally reviewed and interpreted the radiographs.   Assessment:   60 year old female with left knee pain consistent with osteoarthritis.  She has previously had a steroid injection which was not helpful.  I discussed treatment options including physical therapy, additional injections, hyaluronic acid, PRP.  We also discussed that ultimately a total knee replacement would provide her with definitive relief although she is not ready for this.  I discussed that ultimately more recent literature is not shown any benefit of hyaluronic acid compared to steroids.  At this time we discussed PRP.  We did discuss that this is associated with an out-of-pocket cost.  Ultimately she would like to pursue the PRP.  We will plan for referral to Dr. 46 for left knee PRP injection  Plan :    -Left knee PRP injection per Dr. Gillie Manners      I personally saw and evaluated the patient, and participated in the management and treatment plan.  Gillie Manners,  MD Attending Physician, Orthopedic Surgery  This document was dictated using Dragon voice recognition software. A reasonable attempt at proof reading has been made to minimize errors.

## 2021-03-29 ENCOUNTER — Telehealth (HOSPITAL_BASED_OUTPATIENT_CLINIC_OR_DEPARTMENT_OTHER): Payer: Self-pay | Admitting: Family Medicine

## 2021-03-29 NOTE — Telephone Encounter (Signed)
Pt called stated her provider sent in a referral for PRP. Let pt know that Dr. Gayla Medicus CMA will contact the pt to schedule when se is back into the office. Please advise.

## 2021-04-03 NOTE — Telephone Encounter (Signed)
Patient scheduled.

## 2021-04-04 ENCOUNTER — Ambulatory Visit (HOSPITAL_BASED_OUTPATIENT_CLINIC_OR_DEPARTMENT_OTHER): Payer: Medicaid Other | Admitting: Family Medicine

## 2021-04-04 ENCOUNTER — Ambulatory Visit (HOSPITAL_BASED_OUTPATIENT_CLINIC_OR_DEPARTMENT_OTHER): Payer: Self-pay | Admitting: Family Medicine

## 2021-04-04 ENCOUNTER — Other Ambulatory Visit: Payer: Self-pay

## 2021-04-04 DIAGNOSIS — M1712 Unilateral primary osteoarthritis, left knee: Secondary | ICD-10-CM

## 2021-04-04 NOTE — Progress Notes (Signed)
    Procedures performed today:    Procedure: Left knee platelet rich plasma injection Verbal informed consent obtained.  Platelet rich plasma harvested using regenlab system, RegenKit-BCT-3 Time-out conducted.  Noted no overlying erythema, induration, or other signs of local infection.  Skin prepped in a sterile fashion.  Local anesthesia: Topical Ethyl chloride.  With sterile technique, platelet rich plasma injected easily Completed without difficulty  Advised to call if fevers/chills, erythema, induration, drainage, or persistent bleeding.  Impression: Technically successful platelet rich plasma injection.  Independent interpretation of notes and tests performed by another provider:   None.  Brief History, Exam, Impression, and Recommendations:    Osteoarthritis of left knee Patient with left knee osteoarthritis, previously evaluated by Dr. Steward Drone She has previously received intra-articular steroid injections without notable relief of symptoms Dr. Steward Drone discussed alternative treatment options with patient and decision was made to proceed with trial of PRP injection to try to improve symptoms Reviewed investigational nature of PRP, evidence for safety, involving research surrounding its use and treatment of osteoarthritis Discussed risk related to procedure Patient elected to proceed with procedure, procedure note above Patient was provided with handout covering aftercare guidelines Recommend that she can resume home exercise program 2 weeks after injection Recommend that she follow-up with her orthopedic surgeon in 1 to 2 months   ___________________________________________ Geriann Lafont de Peru, MD, ABFM, [facility] Primary [facility] and Sports Medicine [facility]

## 2021-04-04 NOTE — Patient Instructions (Signed)
  Medication Instructions:  Your physician recommends that you continue on your current medications as directed. Please refer to the Current Medication list given to you today. --If you need a refill on any your medications before your next appointment, please call your pharmacy first. If no refills are authorized on file call the office.--    Follow-Up: Your next appointment:   Your physician recommends that you schedule as needed with Dr. de Peru      Thanks for letting us be apart of your health journey!!        [facility] Sports Medicine   Dr. Raymond de Peru II., MD, MPH               We recommend signing up for the patient portal called "MyChart".  Sign up information is provided on this After Visit Summary.  MyChart is used to connect with patients for Virtual Visits (Telemedicine).  Patients are able to view lab/test results, encounter notes, upcoming appointments, etc.  Non-urgent messages can be sent to your provider as well.   To learn more about what you can do with MyChart, please visit --  [email or web address].

## 2021-04-04 NOTE — Assessment & Plan Note (Signed)
Patient with left knee osteoarthritis, previously evaluated by Dr. Steward Drone She has previously received intra-articular steroid injections without notable relief of symptoms Dr. Steward Drone discussed alternative treatment options with patient and decision was made to proceed with trial of PRP injection to try to improve symptoms Reviewed investigational nature of PRP, evidence for safety, involving research surrounding its use and treatment of osteoarthritis Discussed risk related to procedure Patient elected to proceed with procedure, procedure note above Patient was provided with handout covering aftercare guidelines Recommend that she can resume home exercise program 2 weeks after injection Recommend that she follow-up with her orthopedic surgeon in 1 to 2 months

## 2021-04-06 ENCOUNTER — Other Ambulatory Visit (HOSPITAL_BASED_OUTPATIENT_CLINIC_OR_DEPARTMENT_OTHER): Payer: Self-pay

## 2021-04-20 ENCOUNTER — Telehealth: Payer: Self-pay | Admitting: Orthopaedic Surgery

## 2021-04-20 NOTE — Telephone Encounter (Signed)
Pt called stating she got an injection in her knee and it didn't help. So the pt is wondering if she could try the rooster comb injection. She would like a CB to discuss further since she doesn't have insurance but was approved for 75% off through [facility].  [phone]

## 2021-04-21 ENCOUNTER — Telehealth: Payer: Self-pay

## 2021-04-21 NOTE — Telephone Encounter (Signed)
Pt called and wanted to know if we have the rooster comb shots in office. Or what is the process for them ?  She has an upcoming apt for the shot Monday the 31st pt would like to know if she will be receiving it that day.

## 2021-04-24 ENCOUNTER — Ambulatory Visit (HOSPITAL_BASED_OUTPATIENT_CLINIC_OR_DEPARTMENT_OTHER): Payer: Medicaid Other | Admitting: Orthopaedic Surgery

## 2021-04-24 ENCOUNTER — Telehealth: Payer: Self-pay

## 2021-04-24 ENCOUNTER — Other Ambulatory Visit (HOSPITAL_BASED_OUTPATIENT_CLINIC_OR_DEPARTMENT_OTHER): Payer: Self-pay | Admitting: Orthopaedic Surgery

## 2021-04-24 DIAGNOSIS — M1712 Unilateral primary osteoarthritis, left knee: Secondary | ICD-10-CM

## 2021-04-24 NOTE — Telephone Encounter (Signed)
-----   Message from Edd Arbour, LAT sent at 04/24/2021  2:23 PM EDT ----- Regarding: RE: Gel injection Sent you a telephone encounter on Epic. Steward Drone wants to do the single injection so I put in the note for the assistance program request for [employer]. Thanks for your help! ----- Message ----- From: Henrine Screws, RMA Sent: 04/24/2021   9:46 AM EDT To: Edd Arbour, LAT Subject: RE: Gel injection                              For patients who have Medicaid, we have a patient assistance program by the name of Laural Benes and [employer]ural [employer] and the patient will have to complete an application for this and we also have Tri[employer] which is a series of 3 that is $250.00 OOP for one knee.  These are the only 2 options that a patient will have. Please let me know what the patient decides.  Thank you  ----- Message ----- From: Edd Arbour, LAT Sent: 04/21/2021  11:44 AM EDT To: Quantrell Splitt, RMA Subject: Gel injection                                  Good morning,   I know this patient has medicaid, so that will not cover any gel injections. However, she is willing to pay out of pocket and has said [facility] approved to pay 70%. I don't know the process when a patient is willing to self pay with prior authorization.

## 2021-04-24 NOTE — Telephone Encounter (Signed)
Talked with patient and advised her that [facility] does not cover for gel injections.  Patient voiced that she understands. Did advise her about [employer] patient assistance and [employer], but patient declined. Would like to proceed with surgery.

## 2021-04-24 NOTE — Telephone Encounter (Signed)
Noted.  No problem. 

## 2021-05-02 ENCOUNTER — Other Ambulatory Visit (HOSPITAL_COMMUNITY): Payer: Self-pay | Admitting: Internal Medicine

## 2021-05-02 ENCOUNTER — Encounter (HOSPITAL_BASED_OUTPATIENT_CLINIC_OR_DEPARTMENT_OTHER): Payer: Self-pay | Admitting: Family Medicine

## 2021-05-05 ENCOUNTER — Ambulatory Visit (INDEPENDENT_AMBULATORY_CARE_PROVIDER_SITE_OTHER): Payer: Self-pay | Admitting: Cardiology

## 2021-05-05 ENCOUNTER — Encounter: Payer: Self-pay | Admitting: Cardiology

## 2021-05-05 ENCOUNTER — Other Ambulatory Visit: Payer: Self-pay

## 2021-05-05 VITALS — BP 102/76 | HR 86 | Ht 66.0 in | Wt 197.8 lb

## 2021-05-05 DIAGNOSIS — E669 Obesity, unspecified: Secondary | ICD-10-CM

## 2021-05-05 DIAGNOSIS — R7303 Prediabetes: Secondary | ICD-10-CM

## 2021-05-05 DIAGNOSIS — R0989 Other specified symptoms and signs involving the circulatory and respiratory systems: Secondary | ICD-10-CM

## 2021-05-05 DIAGNOSIS — Z79899 Other long term (current) drug therapy: Secondary | ICD-10-CM

## 2021-05-05 DIAGNOSIS — I5020 Unspecified systolic (congestive) heart failure: Secondary | ICD-10-CM

## 2021-05-05 DIAGNOSIS — I428 Other cardiomyopathies: Secondary | ICD-10-CM

## 2021-05-05 DIAGNOSIS — E782 Mixed hyperlipidemia: Secondary | ICD-10-CM

## 2021-05-05 LAB — CBC WITH DIFFERENTIAL/PLATELET
Basophils Absolute: 0 10*3/uL (ref 0.0–0.2)
Basos: 1 %
EOS (ABSOLUTE): 0.1 10*3/uL (ref 0.0–0.4)
Eos: 2 %
Hematocrit: 41.3 % (ref 34.0–46.6)
Hemoglobin: 14.1 g/dL (ref 11.1–15.9)
Immature Grans (Abs): 0 10*3/uL (ref 0.0–0.1)
Immature Granulocytes: 0 %
Lymphocytes Absolute: 1.9 10*3/uL (ref 0.7–3.1)
Lymphs: 43 %
MCH: 30.7 pg (ref 26.6–33.0)
MCHC: 34.1 g/dL (ref 31.5–35.7)
MCV: 90 fL (ref 79–97)
Monocytes Absolute: 0.4 10*3/uL (ref 0.1–0.9)
Monocytes: 9 %
Neutrophils Absolute: 2 10*3/uL (ref 1.4–7.0)
Neutrophils: 45 %
Platelets: 239 10*3/uL (ref 150–450)
RBC: 4.59 x10E6/uL (ref 3.77–5.28)
RDW: 12.8 % (ref 11.7–15.4)
WBC: 4.4 10*3/uL (ref 3.4–10.8)

## 2021-05-05 LAB — BASIC METABOLIC PANEL
BUN/Creatinine Ratio: 24 — ABNORMAL HIGH (ref 9–23)
BUN: 17 mg/dL (ref 6–24)
CO2: 26 mmol/L (ref 20–29)
Calcium: 9.4 mg/dL (ref 8.7–10.2)
Chloride: 99 mmol/L (ref 96–106)
Creatinine, Ser: 0.7 mg/dL (ref 0.57–1.00)
Glucose: 100 mg/dL — ABNORMAL HIGH (ref 70–99)
Potassium: 4.7 mmol/L (ref 3.5–5.2)
Sodium: 138 mmol/L (ref 134–144)
eGFR: 100 mL/min/{1.73_m2} (ref >59)

## 2021-05-05 LAB — HEMOGLOBIN A1C
Est. average glucose Bld gHb Est-mCnc: 114 mg/dL
Hgb A1c MFr Bld: 5.6 % (ref 4.8–5.6)

## 2021-05-05 LAB — MAGNESIUM: Magnesium: 2.2 mg/dL (ref 1.6–2.3)

## 2021-05-05 NOTE — Patient Instructions (Signed)
Medication Instructions:  Your physician recommends that you continue on your current medications as directed. Please refer to the Current Medication list given to you today.  *If you need a refill on your cardiac medications before your next appointment, please call your pharmacy*   Lab Work: Your physician recommends that you return for lab work in:  TODAY: BMET, Mag, CBC, HgbA1C If you have labs (blood work) drawn today and your tests are completely normal, you will receive your results only by: MyChart Message (if you have MyChart) OR A paper copy in the mail If you have any lab test that is abnormal or we need to change your treatment, we will call you to review the results.   Testing/Procedures: None   Follow-Up: At [facility], you and your health needs are our priority.  As part of our continuing mission to provide you with exceptional heart care, we have created designated Provider Care Teams.  These Care Teams include your primary Cardiologist (physician) and Advanced Practice Providers (APPs -  Physician Assistants and Nurse Practitioners) who all work together to provide you with the care you need, when you need it.  We recommend signing up for the patient portal called "MyChart".  Sign up information is provided on this After Visit Summary.  MyChart is used to connect with patients for Virtual Visits (Telemedicine).  Patients are able to view lab/test results, encounter notes, upcoming appointments, etc.  Non-urgent messages can be sent to your provider as well.   To learn more about what you can do with MyChart, go to [email or web address].    Your next appointment:   6 month(s)  The format for your next appointment:   In Person  Provider:   Thomasene Ripple, DO     Other Instructions

## 2021-05-05 NOTE — Progress Notes (Signed)
Cardiology Office Note:    Date:  05/05/2021   ID:  Krista Jenkins, DOB September 19, 1960, MRN [id number]  PCP:  Lise Auer, MD  Cardiologist:  Thomasene Ripple, DO  Electrophysiologist:  None   Referring MD: Lise Auer, MD   " I am doing well"  History of Present Illness:    Krista Jenkins is a 60 y.o. female with a hx of nonischemic cardiomyopathy her recent EF has improved is now 40 to 45% from echo done in November 2021 1 prior EF was 20 to 25% in July 2021, chronic heart failure with reduced ejection fraction, hyperlipidemia presents today for a follow-up visit.  I saw the patient in February 2022 at that time she appeared to be doing well from a cardiovascular standpoint.  Recently she tells me of an episode where she had significant lightheadedness and felt sweaty.  She was at [facility] to sit down and was giving a piece of candy bar which helped her.  She has not had any episodes since. She also tells me that she had some low systolic blood pressure and she was asked to come back on her Entresto to half a pill.  But recently she is gone back up to a whole pill as her blood pressure has improved.  Past Medical History:  Diagnosis Date   ACC/AHA stage C systolic heart failure (HCC) 08/21/2019   Acute systolic CHF (congestive heart failure) (HCC) 06/10/2019   Asthma 05/16/2018   Cervical radiculopathy 06/27/2018   Chronic back pain 05/16/2018   Degeneration of lumbar intervertebral disc 06/27/2018   Depressed left ventricular ejection fraction    Heart failure with reduced ejection fraction (HCC)    Hyperlipidemia    Lumbar radiculopathy 06/28/2018   Mixed hyperlipidemia 08/21/2019   Neck pain 05/16/2018   Nonischemic cardiomyopathy (HCC)    NYHA class 3 heart failure with reduced ejection fraction (HCC) 08/21/2019   Scoliosis deformity of spine 07/17/2018    Past Surgical History:  Procedure Laterality Date   CESAREAN SECTION     RIGHT/LEFT HEART CATH AND CORONARY ANGIOGRAPHY N/A  06/11/2019   Procedure: RIGHT/LEFT HEART CATH AND CORONARY ANGIOGRAPHY;  Surgeon: Corky Crafts, MD;  Location: [facility];  Service: Cardiovascular;  Laterality: N/A;    Current Medications: Current Meds  Medication Sig   amitriptyline (ELAVIL) 25 MG tablet Take 50 mg by mouth at bedtime.   atorvastatin (LIPITOR) 40 MG tablet TAKE 1 TABLET BY MOUTH DAILY AT 6PM   carvedilol (COREG) 25 MG tablet TAKE 1 TABLET BY MOUTH 2 TIMES DAILY WITH A MEAL.   citalopram (CELEXA) 10 MG tablet Take 10 mg by mouth daily.   dapagliflozin propanediol (FARXIGA) 10 MG TABS tablet Take 10 mg by mouth daily before breakfast.   diphenhydramine-acetaminophen (TYLENOL PM) 25-500 MG TABS tablet Take 1 tablet by mouth at bedtime as needed (for pain).   sacubitril-valsartan (ENTRESTO) 49-51 MG Take 1 tablet by mouth 2 (two) times daily.   spironolactone (ALDACTONE) 25 MG tablet Take 1 tablet (25 mg total) by mouth daily.     Allergies:   Latex   Social History   Socioeconomic History   Marital status: Unknown    Spouse name: Not on file   Number of children: Not on file   Years of education: Not on file   Highest education level: Not on file  Occupational History   Not on file  Tobacco Use   Smoking status: Former   Smokeless tobacco: Never  Substance and Sexual Activity   Alcohol use: Not Currently   Drug use: Not on file   Sexual activity: Not on file  Other Topics Concern   Not on file  Social History Narrative   Not on file   Social Determinants of Health   Financial Resource Strain: Not on file  Food Insecurity: Not on file  Transportation Needs: Not on file  Physical Activity: Not on file  Stress: Not on file  Social Connections: Not on file     Family History: The patient's family history includes CAD in her father; Hypertension in her brother.  ROS:   Review of Systems  Constitution: Negative for decreased appetite, fever and weight gain.  HENT: Negative for  congestion, ear discharge, hoarse voice and sore throat.   Eyes: Negative for discharge, redness, vision loss in right eye and visual halos.  Cardiovascular: Negative for chest pain, dyspnea on exertion, leg swelling, orthopnea and palpitations.  Respiratory: Negative for cough, hemoptysis, shortness of breath and snoring.   Endocrine: Negative for heat intolerance and polyphagia.  Hematologic/Lymphatic: Negative for bleeding problem. Does not bruise/bleed easily.  Skin: Negative for flushing, nail changes, rash and suspicious lesions.  Musculoskeletal: Negative for arthritis, joint pain, muscle cramps, myalgias, neck pain and stiffness.  Gastrointestinal: Negative for abdominal pain, bowel incontinence, diarrhea and excessive appetite.  Genitourinary: Negative for decreased libido, genital sores and incomplete emptying.  Neurological: Negative for brief paralysis, focal weakness, headaches and loss of balance.  Psychiatric/Behavioral: Negative for altered mental status, depression and suicidal ideas.  Allergic/Immunologic: Negative for HIV exposure and persistent infections.    EKGs/Labs/Other Studies Reviewed:    The following studies were reviewed today:   EKG: None today   Echo 02/14/2021 IMPRESSIONS   1. Left ventricular ejection fraction, by estimation, is 40 to 45%. The left ventricle has mildly decreased function. The left ventricle has no  regional wall motion abnormalities. Left ventricular diastolic parameters are consistent with Grade I  diastolic dysfunction (impaired relaxation).   2. Right ventricular systolic function is normal. The right ventricular size is normal.   3. Left atrial size was mildly dilated.   4. The mitral valve is normal in structure. Trivial mitral valve regurgitation. No evidence of mitral stenosis.   5. The aortic valve is normal in structure. Aortic valve regurgitation is not visualized. No aortic stenosis is present.   6. The inferior vena cava is  normal in size with greater than 50% respiratory variability, suggesting right atrial pressure of 3 mmHg.   FINDINGS   Left Ventricle: Left ventricular ejection fraction, by estimation, is 40 to 45%. The left ventricle has mildly decreased function. The left  ventricle has no regional wall motion abnormalities. The left ventricular  internal cavity size was normal in  size. There is no left ventricular hypertrophy. Left ventricular diastolic  parameters are consistent with Grade I diastolic dysfunction (impaired  relaxation).   Right Ventricle: The right ventricular size is normal. No increase in  right ventricular wall thickness. Right ventricular systolic function is  normal.   Left Atrium: Left atrial size was mildly dilated.   Right Atrium: Right atrial size was normal in size.   Pericardium: There is no evidence of pericardial effusion.   Mitral Valve: The mitral valve is normal in structure. Trivial mitral  valve regurgitation. No evidence of mitral valve stenosis.   Tricuspid Valve: The tricuspid valve is normal in structure. Tricuspid  valve regurgitation is trivial. No evidence of tricuspid stenosis.  Aortic Valve: The aortic valve is normal in structure. Aortic valve  regurgitation is not visualized. No aortic stenosis is present.   Pulmonic Valve: The pulmonic valve was normal in structure. Pulmonic valve  regurgitation is trivial. No evidence of pulmonic stenosis.   Aorta: The aortic root is normal in size and structure.   Venous: The inferior vena cava is normal in size with greater than 50%  respiratory variability, suggesting right atrial pressure of 3 mmHg.   IAS/Shunts: No atrial level shunt detected by color flow Doppler.       Recent Labs: 05/16/2020: B Natriuretic Peptide 18.2 11/30/2020: BUN 19; Creatinine, Ser 0.86; Potassium 4.1; Sodium 137  Recent Lipid Panel No results found for: CHOL, TRIG, HDL, CHOLHDL, VLDL, LDLCALC, LDLDIRECT  Physical Exam:     VS:  BP 102/76   Pulse 86   Ht 5\' 6"  (1.676 m)   Wt 197 lb 12.8 oz (89.7 kg)   LMP  (LMP Unknown)   SpO2 95%   BMI 31.93 kg/m     Wt Readings from Last 3 Encounters:  05/05/21 197 lb 12.8 oz (89.7 kg)  03/24/21 191 lb (86.6 kg)  02/14/21 191 lb (86.6 kg)     GEN: Well nourished, well developed in no acute distress HEENT: Normal NECK: No JVD; No carotid bruits LYMPHATICS: No lymphadenopathy CARDIAC: S1S2 noted,RRR, no murmurs, rubs, gallops RESPIRATORY:  Clear to auscultation without rales, wheezing or rhonchi  ABDOMEN: Soft, non-tender, non-distended, +bowel sounds, no guarding. EXTREMITIES: No edema, No cyanosis, no clubbing MUSCULOSKELETAL:  No deformity  SKIN: Warm and dry NEUROLOGIC:  Alert and oriented x 3, non-focal PSYCHIATRIC:  Normal affect, good insight  ASSESSMENT:    1. Depressed left ventricular ejection fraction   2. Medication management   3. Prediabetes   4. ACC/AHA stage C systolic heart failure (HCC)   5. Heart failure with reduced ejection fraction (HCC)   6. Nonischemic cardiomyopathy (HCC)   7. Mixed hyperlipidemia   8. Obesity (BMI 30-39.9)    PLAN:    She appears to be doing well from a cardiovascular standpoint.  No medication changes will be done today.  We reviewed her echocardiogram which was done in August 2023 by the heart failure clinic.  She is currently on carvedilol 25 mg twice daily, Entresto 49-51 mg twice daily, spironolactone 25 mg daily and Farxiga 10 mg daily.  Hyperlipidemia - continue with current statin medication.   The patient is in agreement with the above plan. The patient left the office in stable condition.  The patient will follow up in 6 months or sooner if needed.   Medication Adjustments/Labs and Tests Ordered: Current medicines are reviewed at length with the patient today.  Concerns regarding medicines are outlined above.  Orders Placed This Encounter  Procedures   Basic Metabolic Panel (BMET)    Magnesium   CBC with Differential/Platelet   Hemoglobin A1c   No orders of the defined types were placed in this encounter.   Patient Instructions  Medication Instructions:  Your physician recommends that you continue on your current medications as directed. Please refer to the Current Medication list given to you today.  *If you need a refill on your cardiac medications before your next appointment, please call your pharmacy*   Lab Work: Your physician recommends that you return for lab work in:  TODAY: BMET, Mag, CBC, HgbA1C If you have labs (blood work) drawn today and your tests are completely normal, you will receive your results only  by: MyChart Message (if you have MyChart) OR A paper copy in the mail If you have any lab test that is abnormal or we need to change your treatment, we will call you to review the results.   Testing/Procedures: None   Follow-Up: At [facility], you and your health needs are our priority.  As part of our continuing mission to provide you with exceptional heart care, we have created designated Provider Care Teams.  These Care Teams include your primary Cardiologist (physician) and Advanced Practice Providers (APPs -  Physician Assistants and Nurse Practitioners) who all work together to provide you with the care you need, when you need it.  We recommend signing up for the patient portal called "MyChart".  Sign up information is provided on this After Visit Summary.  MyChart is used to connect with patients for Virtual Visits (Telemedicine).  Patients are able to view lab/test results, encounter notes, upcoming appointments, etc.  Non-urgent messages can be sent to your provider as well.   To learn more about what you can do with MyChart, go to [email or web address].    Your next appointment:   6 month(s)  The format for your next appointment:   In Person  Provider:   Thomasene Ripple, DO     Other Instructions     Adopting a Healthy  Lifestyle.  Know what a healthy weight is for you (roughly BMI <25) and aim to maintain this   Aim for 7+ servings of fruits and vegetables daily   65-80+ fluid ounces of water or unsweet tea for healthy kidneys   Limit to max 1 drink of alcohol per day; avoid smoking/tobacco   Limit animal fats in diet for cholesterol and heart health - choose grass fed whenever available   Avoid highly processed foods, and foods high in saturated/trans fats   Aim for low stress - take time to unwind and care for your mental health   Aim for 150 min of moderate intensity exercise weekly for heart health, and weights twice weekly for bone health   Aim for 7-9 hours of sleep daily   When it comes to diets, agreement about the perfect plan isnt easy to find, even among the experts. Experts at the [employer] developed an idea known as the Healthy Eating Plate. Just imagine a plate divided into logical, healthy portions.   The emphasis is on diet quality:   Load up on vegetables and fruits - one-half of your plate: Aim for color and variety, and remember that potatoes dont count.   Go for whole grains - one-quarter of your plate: Whole wheat, barley, wheat berries, quinoa, oats, brown rice, and foods made with them. If you want pasta, go with whole wheat pasta.   Protein power - one-quarter of your plate: Fish, chicken, beans, and nuts are all healthy, versatile protein sources. Limit red meat.   The diet, however, does go beyond the plate, offering a few other suggestions.   Use healthy plant oils, such as olive, canola, soy, corn, sunflower and peanut. Check the labels, and avoid partially hydrogenated oil, which have unhealthy trans fats.   If youre thirsty, drink water. Coffee and tea are good in moderation, but skip sugary drinks and limit milk and dairy products to one or two daily servings.   The type of carbohydrate in the diet is more important than the amount. Some  sources of carbohydrates, such as vegetables, fruits, whole grains, and beans-are healthier than others.  Finally, stay active  Signed, Thomasene Ripple, DO  05/05/2021 1:49 PM    [facility]

## 2021-05-09 ENCOUNTER — Telehealth: Payer: Self-pay | Admitting: Cardiology

## 2021-05-09 NOTE — Telephone Encounter (Signed)
Follow Up:    This patient is returning Jasmine's call, concerning her lab results.

## 2021-05-09 NOTE — Telephone Encounter (Signed)
Krista Ripple, DO  05/05/2021  8:11 PM EST Labs stable [place], [place]  05/09/2021  8:14 AM EST Patient called.  Left message for patient to call back.  Pt informed of providers result & recommendations. Pt verbalized understanding. No further questions .

## 2021-05-10 ENCOUNTER — Other Ambulatory Visit: Payer: Self-pay

## 2021-05-10 ENCOUNTER — Ambulatory Visit (INDEPENDENT_AMBULATORY_CARE_PROVIDER_SITE_OTHER): Payer: Self-pay | Admitting: Surgical

## 2021-05-10 ENCOUNTER — Encounter: Payer: Self-pay | Admitting: Orthopedic Surgery

## 2021-05-10 DIAGNOSIS — M1712 Unilateral primary osteoarthritis, left knee: Secondary | ICD-10-CM

## 2021-05-11 ENCOUNTER — Encounter: Payer: Self-pay | Admitting: Orthopedic Surgery

## 2021-05-11 MED ORDER — METHYLPREDNISOLONE ACETATE 40 MG/ML IJ SUSP
40.0000 mg | INTRAMUSCULAR | Status: AC | PRN
Start: 2021-05-10 — End: 2021-05-10
  Administered 2021-05-10: 17:00:00 40 mg via INTRA_ARTICULAR

## 2021-05-11 MED ORDER — LIDOCAINE HCL 1 % IJ SOLN
5.0000 mL | INTRAMUSCULAR | Status: AC | PRN
  Administered 2021-05-10: 17:00:00 5 mL

## 2021-05-11 MED ORDER — BUPIVACAINE HCL 0.25 % IJ SOLN
4.0000 mL | INTRAMUSCULAR | Status: AC | PRN
  Administered 2021-05-10: 17:00:00 4 mL via INTRA_ARTICULAR

## 2021-05-11 NOTE — Progress Notes (Signed)
Office Visit Note   Patient: Krista Jenkins           Date of Birth: 1960-11-06           MRN: [id number] Visit Date: 05/10/2021 Requested by: Huel Cote, MD [address] [place],  [place] [address] PCP: Lise Auer, MD  Subjective: Chief Complaint  Patient presents with   Left Knee - Follow-up    HPI: Krista Jenkins is a 60 y.o. female who presents to the office complaining of left knee pain.  Patient complains of about 1 year duration of left knee pain.  She localizes most of her pain to the medial aspect of the left knee with no radiation down the shin but does endorse occasional calf pain.  No radicular pain, groin pain, low back pain.  She states that this left knee pain has been worsening over the last few months to the point where she cannot exercise without pain.  Most of her pain is with weightbearing such as with walking or ascending/descending stairs.  She feels like the knee wants to give out but no frank instability.  She is never had any previous knee surgery or any hip/back surgeries.  She takes Tylenol for pain control as she cannot take NSAIDs due to her history of CHF.  She has had previous cortisone injection by her PCP that was administered with the knee in flexion through the anterior aspect of the left knee.  She is also had PRP injection that was set up by Dr. Steward Drone on 03/24/2021 but this is provided no relief even for 1 day.  She had no relief from her previous cortisone injection either for any amount of time.  She has had physical therapy for 2 weeks as she was doing the same exercises that her mother was doing following recent knee replacement.  This did not really help but she states that "maybe if she did it for long period of time she could see it helping"  She also does endorse a history of "previous back issues" with ESI's 2 years ago.  She has MRI of the lumbar spine from 2018 demonstrating spondylosis mildly without any significant foraminal  stenosis..                ROS: All systems reviewed are negative as they relate to the chief complaint within the history of present illness.  Patient denies fevers or chills.  Assessment & Plan: Visit Diagnoses:  1. Primary osteoarthritis of left knee     Plan: Patient is a 60 year old female who presents for evaluation of left knee pain.  She is accompanied by her husband and is somewhat exasperated by her worsening left knee pain as she has had no relief from injections thus far.  Radiographs demonstrate moderate to severe degenerative changes of the medial and patellofemoral compartments primarily.  Discussed the options available to patient.  Based on talking with her, she does not seem to be in the mindset that she wants to pursue total knee arthroplasty at this point.  This is really the only surgical option she has for predictable relief of her pain.  Short of surgery, could try gel injection but she has no insurance and is on [facility] financial assistance so getting approved for this gel injection would be a lengthy process.  She states that she does not want to go through this.  She would like to try repeat cortisone injection today to see if this will have any  relief.  Cortisone injection was administered from superolateral approach which is statistically more accurate at ensuring intra-articular injection then injecting anteriorly.  Patient tolerated the procedure well and 20 cc of inflammatory but not purulent synovial fluid was aspirated prior to injection.  She will pay attention to how much relief she gets over the next 1 to 2 weeks.  If she has no relief or limited relief at 2 weeks out, she will call the office to let us know.  Also recommended she start a regular exercise program consisting mostly of nonloadbearing quadricep strengthening exercises as well as stationary bike.  Follow-Up Instructions: No follow-ups on file.   Orders:  No orders of the defined types were placed in this  encounter.  No orders of the defined types were placed in this encounter.     Procedures: Large Joint Inj: L knee on 05/10/2021 5:14 PM Indications: diagnostic evaluation, joint swelling and pain Details: 18 G 1.5 in needle, superolateral approach  Arthrogram: No  Medications: 5 mL lidocaine 1 %; 40 mg methylPREDNISolone acetate 40 MG/ML; 4 mL bupivacaine 0.25 % Aspirate: 20 mL Outcome: tolerated well, no immediate complications Procedure, treatment alternatives, risks and benefits explained, specific risks discussed. Consent was given by the patient. Immediately prior to procedure a time out was called to verify the correct patient, procedure, equipment, support staff and site/side marked as required. Patient was prepped and draped in the usual sterile fashion.      Clinical Data: No additional findings.  Objective: Vital Signs: LMP  (LMP Unknown)   Physical Exam:  Constitutional: Patient appears well-developed HEENT:  Head: Normocephalic Eyes:EOM are normal Neck: Normal range of motion Cardiovascular: Normal rate Pulmonary/chest: Effort normal Neurologic: Patient is alert Skin: Skin is warm Psychiatric: Patient has normal mood and affect  Ortho Exam: Ortho exam demonstrates left knee with 0 degrees extension and 115 degrees of knee flexion.  No pain with hip range of motion. She will sign.  Tenderness over the medial joint line primarily.  No tenderness over the lateral joint line.  Patellar crepitus noted with passive and active flexion/extension.  Positive patellar grind test.  No calf tenderness.  Negative Homans' sign.  Able to perform straight leg raise with excellent quadricep strength rated 5/5.  She does have ecchymosis noted around the proximal medial aspect of the shin with a little bit of associated tenderness but most of her tenderness on exam is over the medial joint line.  There is no palpable mass over the medial proximal shin.  5/5 motor strength of bilateral  hip flexion, quadricep, hamstring, dorsiflexion, plantarflexion.  Specialty Comments:  No specialty comments available.  Imaging: No results found.   PMFS History: Patient Active Problem List   Diagnosis Date Noted   Osteoarthritis of left knee 04/04/2021   Obesity (BMI 30-39.9) 08/16/2020   Nonischemic cardiomyopathy (HCC)    Hyperlipidemia    ACC/AHA stage C systolic heart failure (HCC) 08/21/2019   Mixed hyperlipidemia 08/21/2019   Heart failure with reduced ejection fraction (HCC) 08/21/2019   NYHA class 3 heart failure with reduced ejection fraction (HCC) 08/21/2019   Depressed left ventricular ejection fraction 08/21/2019   Acute systolic CHF (congestive heart failure) (HCC) 06/10/2019   Scoliosis deformity of spine 07/17/2018   Lumbar radiculopathy 06/28/2018   Cervical radiculopathy 06/27/2018   Degeneration of lumbar intervertebral disc 06/27/2018   Chronic back pain 05/16/2018   Asthma 05/16/2018   Neck pain 05/16/2018   Past Medical History:  Diagnosis Date   ACC/AHA  stage C systolic heart failure (HCC) 08/21/2019   Acute systolic CHF (congestive heart failure) (HCC) 06/10/2019   Asthma 05/16/2018   Cervical radiculopathy 06/27/2018   Chronic back pain 05/16/2018   Degeneration of lumbar intervertebral disc 06/27/2018   Depressed left ventricular ejection fraction    Heart failure with reduced ejection fraction (HCC)    Hyperlipidemia    Lumbar radiculopathy 06/28/2018   Mixed hyperlipidemia 08/21/2019   Neck pain 05/16/2018   Nonischemic cardiomyopathy (HCC)    NYHA class 3 heart failure with reduced ejection fraction (HCC) 08/21/2019   Scoliosis deformity of spine 07/17/2018    Family History  Problem Relation Age of Onset   CAD Father    Hypertension Brother     Past Surgical History:  Procedure Laterality Date   CESAREAN SECTION     RIGHT/LEFT HEART CATH AND CORONARY ANGIOGRAPHY N/A 06/11/2019   Procedure: RIGHT/LEFT HEART CATH AND CORONARY ANGIOGRAPHY;   Surgeon: Corky Crafts, MD;  Location: [facility];  Service: Cardiovascular;  Laterality: N/A;   Social History   Occupational History   Not on file  Tobacco Use   Smoking status: Former   Smokeless tobacco: Never  Substance and Sexual Activity   Alcohol use: Not Currently   Drug use: Not on file   Sexual activity: Not on file

## 2021-05-23 ENCOUNTER — Other Ambulatory Visit (HOSPITAL_COMMUNITY): Payer: Self-pay | Admitting: Internal Medicine

## 2021-05-23 DIAGNOSIS — I5020 Unspecified systolic (congestive) heart failure: Secondary | ICD-10-CM

## 2021-05-24 ENCOUNTER — Telehealth: Payer: Self-pay | Admitting: Cardiology

## 2021-05-24 MED ORDER — DAPAGLIFLOZIN PROPANEDIOL 10 MG PO TABS
10.0000 mg | ORAL_TABLET | Freq: Every day | ORAL | 3 refills | Status: DC
Start: 2021-05-24 — End: 2021-09-18

## 2021-05-24 NOTE — Telephone Encounter (Signed)
*  STAT* If patient is at the pharmacy, call can be transferred to refill team.   1. Which medications need to be refilled? (please list name of each medication and dose if known)  dapagliflozin propanediol (FARXIGA) 10 MG TABS tablet  2. Which pharmacy/location (including street and city if local pharmacy) is medication to be sent to? [facility] - [place], [place] - [address]  3. Do they need a 30 day or 90 day supply? 90 with refills   Patient gets this medication through [employer], the [employer] assistance foundation.

## 2021-05-24 NOTE — Telephone Encounter (Signed)
Pt's medication was sent to pt's pharmacy as requested. Confirmation received.  °

## 2021-05-25 ENCOUNTER — Telehealth: Payer: Self-pay | Admitting: Surgical

## 2021-05-25 NOTE — Telephone Encounter (Signed)
Patient called advised her left knee is not any better. Patient would like to know her next plan of care. The number to contact patient is [phone]

## 2021-05-26 NOTE — Telephone Encounter (Signed)
I called patient.  She has had worsening pain in her knee for the last week and had no significant relief from injection.  She would like surgical consultation with Dr. August Saucer. Offered for her to see Dr. Steward Drone as another option but I don't think he does total knee replacement and sticks to unicompartmental. Could you arrange next available appointment with Dr. August Saucer for surgical consultation and call her with date/time?

## 2021-05-26 NOTE — Telephone Encounter (Signed)
Holding for lauren 

## 2021-05-29 NOTE — Telephone Encounter (Signed)
IC appointment scheduled.

## 2021-06-07 ENCOUNTER — Ambulatory Visit (INDEPENDENT_AMBULATORY_CARE_PROVIDER_SITE_OTHER): Payer: Self-pay | Admitting: Orthopedic Surgery

## 2021-06-07 ENCOUNTER — Other Ambulatory Visit: Payer: Self-pay

## 2021-06-07 DIAGNOSIS — M1712 Unilateral primary osteoarthritis, left knee: Secondary | ICD-10-CM

## 2021-06-07 MED ORDER — TRAMADOL HCL 50 MG PO TABS: 50.0000 mg | ORAL_TABLET | Freq: Two times a day (BID) | ORAL | 0 refills | Status: DC | PRN

## 2021-06-08 ENCOUNTER — Telehealth (HOSPITAL_COMMUNITY): Payer: Self-pay | Admitting: *Deleted

## 2021-06-08 NOTE — Telephone Encounter (Signed)
Pt left vm asking if she needed an echo with her appt for surgical clearance on 1/27. Pt last echo 8/22 EF 40-45%   Routed to Dr.Bensimhon for advice

## 2021-06-09 NOTE — Telephone Encounter (Signed)
Pt aware.

## 2021-06-11 ENCOUNTER — Encounter: Payer: Self-pay | Admitting: Orthopedic Surgery

## 2021-06-11 NOTE — Progress Notes (Signed)
Office Visit Note   Patient: Krista Jenkins           Date of Birth: 04/12/1961           MRN: [id number] Visit Date: 06/07/2021 Requested by: Lise Auer, MD [address] [place],  [place] [address] PCP: Lise Auer, MD  Subjective: Chief Complaint  Patient presents with   Left Knee - Pain    HPI: [name] is a 60 year old patient with left knee pain.  She has known history of left knee arthritis.  Had an injection 05/10/2021 which did not help very much.  Reports constant pain and symptoms that are worsening with night pain and rest pain as well as pain with ambulation.  She does have idiopathic heart failure.  She states her ejection fraction has improved over the last year from approximately 20% to currently 40 to 45%.  She is not currently on any blood thinners.              ROS: All systems reviewed are negative as they relate to the chief complaint within the history of present illness.  Patient denies  fevers or chills.   Assessment & Plan: Visit Diagnoses:  1. Primary osteoarthritis of left knee     Plan: Impression is left knee arthritis refractory to nonoperative management in a patient who has significant cardiac issues.  Her ejection fraction is improving but at this time we could benefit from discussion of cardiac risk stratification and optimization strategies from her cardiologist Dr. Dani Gobble.  We discussed today how entire elective total joint arthroplasty process does put a stress on your body.  Hard to say whether or not waiting for her cardiac status to improve could improve her risk profile.  Today we aspirated the knee.  Tramadol prescribed.  We will see what her primary care physician and cardiologist think about her suitability for knee replacement.  Follow-Up Instructions: No follow-ups on file.   Orders:  No orders of the defined types were placed in this encounter.  Meds ordered this encounter  Medications   traMADol (ULTRAM) 50 MG tablet     Sig: Take 1 tablet (50 mg total) by mouth every 12 (twelve) hours as needed.    Dispense:  30 tablet    Refill:  0      Procedures: No procedures performed   Clinical Data: No additional findings.  Objective: Vital Signs: LMP  (LMP Unknown)   Physical Exam:   Constitutional: Patient appears well-developed HEENT:  Head: Normocephalic Eyes:EOM are normal Neck: Normal range of motion Cardiovascular: Normal rate Pulmonary/chest: Effort normal Neurologic: Patient is alert Skin: Skin is warm Psychiatric: Patient has normal mood and affect   Ortho Exam: Ortho exam demonstrates mild effusion left knee.  Slight varus alignment.  Pedal pulses palpable.  Ankle dorsiflexion plantarflexion intact.  Collateral and cruciate ligaments are stable.  She does have about a 5 degree flexion contracture in the left knee but can flex to about  105 degrees.  No groin pain with internal external rotation of the leg.  Specialty Comments:  No specialty comments available.  Imaging: No results found.   PMFS History: Patient Active Problem List   Diagnosis Date Noted   Osteoarthritis of left knee 04/04/2021   Obesity (BMI 30-39.9) 08/16/2020   Nonischemic cardiomyopathy (HCC)    Hyperlipidemia    ACC/AHA stage C systolic heart failure (HCC) 08/21/2019   Mixed hyperlipidemia 08/21/2019   Heart failure with reduced ejection fraction (HCC)  08/21/2019   NYHA class 3 heart failure with reduced ejection fraction (HCC) 08/21/2019   Depressed left ventricular ejection fraction 08/21/2019   Acute systolic CHF (congestive heart failure) (HCC) 06/10/2019   Scoliosis deformity of spine 07/17/2018   Lumbar radiculopathy 06/28/2018   Cervical radiculopathy 06/27/2018   Degeneration of lumbar intervertebral disc 06/27/2018   Chronic back pain 05/16/2018   Asthma 05/16/2018   Neck pain 05/16/2018   Past Medical History:  Diagnosis Date   ACC/AHA stage C systolic heart failure (HCC) 08/21/2019    Acute systolic CHF (congestive heart failure) (HCC) 06/10/2019   Asthma 05/16/2018   Cervical radiculopathy 06/27/2018   Chronic back pain 05/16/2018   Degeneration of lumbar intervertebral disc 06/27/2018   Depressed left ventricular ejection fraction    Heart failure with reduced ejection fraction (HCC)    Hyperlipidemia    Lumbar radiculopathy 06/28/2018   Mixed hyperlipidemia 08/21/2019   Neck pain 05/16/2018   Nonischemic cardiomyopathy (HCC)    NYHA class 3 heart failure with reduced ejection fraction (HCC) 08/21/2019   Scoliosis deformity of spine 07/17/2018    Family History  Problem Relation Age of Onset   CAD Father    Hypertension Brother     Past Surgical History:  Procedure Laterality Date   CESAREAN SECTION     RIGHT/LEFT HEART CATH AND CORONARY ANGIOGRAPHY N/A 06/11/2019   Procedure: RIGHT/LEFT HEART CATH AND CORONARY ANGIOGRAPHY;  Surgeon: Corky Crafts, MD;  Location: [facility];  Service: Cardiovascular;  Laterality: N/A;   Social History   Occupational History   Not on file  Tobacco Use   Smoking status: Former   Smokeless tobacco: Never  Substance and Sexual Activity   Alcohol use: Not Currently   Drug use: Not on file   Sexual activity: Not on file

## 2021-06-16 ENCOUNTER — Other Ambulatory Visit: Payer: Self-pay | Admitting: Cardiology

## 2021-07-21 ENCOUNTER — Encounter (HOSPITAL_COMMUNITY): Payer: Medicaid Other | Admitting: Internal Medicine

## 2021-07-31 ENCOUNTER — Encounter (HOSPITAL_COMMUNITY): Payer: Medicaid Other | Admitting: Internal Medicine

## 2021-08-03 ENCOUNTER — Telehealth: Payer: Self-pay

## 2021-08-03 NOTE — Telephone Encounter (Signed)
Called pt to see which medication the patient assistance needs to be filled out for. No answer at this time. Left message for her to return the call.

## 2021-08-04 ENCOUNTER — Ambulatory Visit (HOSPITAL_COMMUNITY)
Admission: RE | Admit: 2021-08-04 | Discharge: 2021-08-04 | Disposition: A | Discharge status: Home / Self Care | Payer: Managed Care, Other (non HMO) | Source: Ambulatory Visit | Attending: Internal Medicine | Admitting: Internal Medicine

## 2021-08-04 ENCOUNTER — Other Ambulatory Visit: Payer: Self-pay

## 2021-08-04 VITALS — BP 106/72 | HR 84 | Ht 66.0 in | Wt 190.6 lb

## 2021-08-04 DIAGNOSIS — E782 Mixed hyperlipidemia: Secondary | ICD-10-CM | POA: Insufficient documentation

## 2021-08-04 DIAGNOSIS — G8929 Other chronic pain: Secondary | ICD-10-CM | POA: Insufficient documentation

## 2021-08-04 DIAGNOSIS — M549 Dorsalgia, unspecified: Secondary | ICD-10-CM | POA: Insufficient documentation

## 2021-08-04 DIAGNOSIS — R5383 Other fatigue: Secondary | ICD-10-CM | POA: Insufficient documentation

## 2021-08-04 DIAGNOSIS — I10 Essential (primary) hypertension: Secondary | ICD-10-CM | POA: Diagnosis not present

## 2021-08-04 DIAGNOSIS — Z7984 Long term (current) use of oral hypoglycemic drugs: Secondary | ICD-10-CM | POA: Insufficient documentation

## 2021-08-04 DIAGNOSIS — I428 Other cardiomyopathies: Secondary | ICD-10-CM | POA: Insufficient documentation

## 2021-08-04 DIAGNOSIS — E669 Obesity, unspecified: Secondary | ICD-10-CM | POA: Diagnosis not present

## 2021-08-04 DIAGNOSIS — I5022 Chronic systolic (congestive) heart failure: Secondary | ICD-10-CM | POA: Insufficient documentation

## 2021-08-04 DIAGNOSIS — I5020 Unspecified systolic (congestive) heart failure: Secondary | ICD-10-CM | POA: Diagnosis not present

## 2021-08-04 DIAGNOSIS — R0683 Snoring: Secondary | ICD-10-CM | POA: Insufficient documentation

## 2021-08-04 DIAGNOSIS — I110 Hypertensive heart disease with heart failure: Secondary | ICD-10-CM | POA: Insufficient documentation

## 2021-08-04 DIAGNOSIS — Z79899 Other long term (current) drug therapy: Secondary | ICD-10-CM | POA: Insufficient documentation

## 2021-08-04 LAB — CBC
HCT: 41 % (ref 36.0–46.0)
Hemoglobin: 13.3 g/dL (ref 12.0–15.0)
MCH: 30.4 pg (ref 26.0–34.0)
MCHC: 32.4 g/dL (ref 30.0–36.0)
MCV: 93.6 fL (ref 80.0–100.0)
Platelets: 227 10*3/uL (ref 150–400)
RBC: 4.38 MIL/uL (ref 3.87–5.11)
RDW: 12.6 % (ref 11.5–15.5)
WBC: 5.8 10*3/uL (ref 4.0–10.5)
nRBC: 0 % (ref 0.0–0.2)

## 2021-08-04 LAB — COMPREHENSIVE METABOLIC PANEL
ALT: 22 U/L (ref 0–44)
AST: 21 U/L (ref 15–41)
Albumin: 4 g/dL (ref 3.5–5.0)
Alkaline Phosphatase: 80 U/L (ref 38–126)
Anion gap: 12 (ref 5–15)
BUN: 31 mg/dL — ABNORMAL HIGH (ref 6–20)
CO2: 25 mmol/L (ref 22–32)
Calcium: 9.3 mg/dL (ref 8.9–10.3)
Chloride: 98 mmol/L (ref 98–111)
Creatinine, Ser: 0.87 mg/dL (ref 0.44–1.00)
GFR, Estimated: >60 mL/min (ref >60)
Glucose, Bld: 95 mg/dL (ref 70–99)
Potassium: 4.2 mmol/L (ref 3.5–5.1)
Sodium: 135 mmol/L (ref 135–145)
Total Bilirubin: 0.4 mg/dL (ref 0.3–1.2)
Total Protein: 7.3 g/dL (ref 6.5–8.1)

## 2021-08-04 LAB — T4, FREE: Free T4: 1.02 ng/dL (ref 0.61–1.12)

## 2021-08-04 LAB — KETONES, URINE: Ketones, ur: 20 mg/dL — AB

## 2021-08-04 LAB — TSH: TSH: 2.125 u[IU]/mL (ref 0.350–4.500)

## 2021-08-04 NOTE — Progress Notes (Signed)
ADVANCED HF CLINIC NOTE  Primary Care: Dr. Luna Kitchens [facility])  Primary Cardiologist: Dr. [name]: Dr. Gala Romney   Reason for Visit: f/u for chronic systolic heart failure  HPI: Krista Jenkins is a 61 y.o. female with a HL, chronic back pain and systolic HF due to NICM.  Presented to [facility] in 12/20 with new onset HF. EF 20-25% on echo. Transferred to [facility] for cath. Cath with EF < 25% and normal cors. Pulmonary capillary wedge pressure was 15 mmHg and a mean pulmonary pressure was 18 mmHg, cardiac output 4.9 L with a cardiac index of 2.5.   Echo 11/21 EF 40-45%  Today she returns for HF follow up. Overall doing ok but feels much more fatigued. Denies CP or SOB. No edema, orthopnea or PND. She is on her 5th week of a very strict diet (1,000 cals/day) and she wonders if it is related to the diet. Previously decreased Entresto due to low BP but BP went back up so she increased it back up. She doen't think it is related to meds. BP ok. Husband says she snores mildly.   Cardiac Studies: cMRI (1/21): EF 16%, no LGE, mild MR. Normal RV Echo (10/01/19): EF < 20% normal RV.  Echo (01/21/20): EF slightly improved 25% RV normal. Echo (11/21): EF 40-45% RV ok.  Past Medical History:  Diagnosis Date   ACC/AHA stage C systolic heart failure (HCC) 08/21/2019   Acute systolic CHF (congestive heart failure) (HCC) 06/10/2019   Asthma 05/16/2018   Cervical radiculopathy 06/27/2018   Chronic back pain 05/16/2018   Degeneration of lumbar intervertebral disc 06/27/2018   Depressed left ventricular ejection fraction    Heart failure with reduced ejection fraction (HCC)    Hyperlipidemia    Lumbar radiculopathy 06/28/2018   Mixed hyperlipidemia 08/21/2019   Neck pain 05/16/2018   Nonischemic cardiomyopathy (HCC)    NYHA class 3 heart failure with reduced ejection fraction (HCC) 08/21/2019   Scoliosis deformity of spine 07/17/2018    Current Outpatient Medications  Medication Sig Dispense  Refill   amitriptyline (ELAVIL) 25 MG tablet Take 50 mg by mouth at bedtime.     atorvastatin (LIPITOR) 40 MG tablet TAKE 1 TABLET BY MOUTH DAILY AT 6PM. 90 tablet 0   carvedilol (COREG) 25 MG tablet TAKE 1 TABLET BY MOUTH 2 TIMES DAILY WITH A MEAL. 180 tablet 2   citalopram (CELEXA) 10 MG tablet Take 10 mg by mouth daily.     dapagliflozin propanediol (FARXIGA) 10 MG TABS tablet Take 1 tablet (10 mg total) by mouth daily before breakfast. 90 tablet 3   diphenhydramine-acetaminophen (TYLENOL PM) 25-500 MG TABS tablet Take 1 tablet by mouth at bedtime as needed (for pain).     sacubitril-valsartan (ENTRESTO) 97-103 MG Take 1 tablet by mouth 2 (two) times daily.     spironolactone (ALDACTONE) 25 MG tablet TAKE 1 TABLET BY MOUTH DAILY. 90 tablet 2   traMADol (ULTRAM) 50 MG tablet Take 1 tablet (50 mg total) by mouth every 12 (twelve) hours as needed. 30 tablet 0   No current facility-administered medications for this encounter.   Allergies  Allergen Reactions   Latex     rash   Social History   Socioeconomic History   Marital status: Unknown    Spouse name: Not on file   Number of children: Not on file   Years of education: Not on file   Highest education level: Not on file  Occupational History  Not on file  Tobacco Use   Smoking status: Former   Smokeless tobacco: Never  Substance and Sexual Activity   Alcohol use: Not Currently   Drug use: Not on file   Sexual activity: Not on file  Other Topics Concern   Not on file  Social History Narrative   Not on file   Social Determinants of Health   Financial Resource Strain: Not on file  Food Insecurity: Not on file  Transportation Needs: Not on file  Physical Activity: Not on file  Stress: Not on file  Social Connections: Not on file  Intimate Partner Violence: Not on file   Family History  Problem Relation Age of Onset   CAD Father    Hypertension Brother    Vitals:   08/04/21 1408  BP: 106/72  Pulse: 84  SpO2:  99%  Weight: 86.5 kg (190 lb 9.6 oz)  Height: 5\' 6"  (1.676 m)    Wt Readings from Last 3 Encounters:  08/04/21 86.5 kg (190 lb 9.6 oz)  05/05/21 89.7 kg (197 lb 12.8 oz)  03/24/21 86.6 kg (191 lb)   PHYSICAL EXAM: General:  Well appearing. No resp difficulty HEENT: normal Neck: supple. no JVD. Carotids 2+ bilat; no bruits. No lymphadenopathy or thryomegaly appreciated. Cor: PMI nondisplaced. Regular rate & rhythm. No rubs, gallops or murmurs. Lungs: clear Abdomen: obese soft, nontender, nondistended. No hepatosplenomegaly. No bruits or masses. Good bowel sounds. Extremities: no cyanosis, clubbing, rash, edema Neuro: alert & orientedx3, cranial nerves grossly intact. moves all 4 extremities w/o difficulty. Affect pleasant    ASSESSMENT & PLAN:  1. 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 EF < 20% - cMRI 1/21 LVEF 16% with no LGE Mild MR. Normal RV - Echo (10/01/19): EF < 20% normal RV.  - Echo (01/21/20): EF 25%, RV normal.  - Echo (05/15/20): EF 40-45% RV normal.  - Echo 02/14/21: EF 40-45% RV ok  - Bedside echo today 08/04/21 in setting of increased fatigue EF 40-45% - Stable NYHA II, volume status ok  - Continue Entresto 97/103 bid  - Continue carvedilol 25 mg bid. - Continue Farxiga 10 mg daily. - Continue spiro 25 mg daily. - f/u 6 months with formal echo  2. HTN - Blood pressure well controlled. Continue current regimen.   3. HLD - Followed by general cardiology. - Continue atorvastatin.  4. Obesity - Doing well with weight loss program   5. Fatigue - suspect she may be ketotic from her diet program. - check CBC, BMET, thyroid panel and urine ketones  10/02/21, MD  9:35 PM

## 2021-08-04 NOTE — Patient Instructions (Addendum)
Labs done today, your results will be available in MyChart, we will contact you for abnormal readings.  Your physician recommends that you schedule a follow-up appointment in: 6 months with an echocardiogram (August 2023), **PLEASE CALL OUR OFFICE IN June TO SCHEDULE THIS APPOINTMENT  If you have any questions or concerns before your next appointment please send [place] a message through [place] or call our office at [phone].    TO LEAVE A MESSAGE FOR THE NURSE SELECT OPTION 2, PLEASE LEAVE A MESSAGE INCLUDING: YOUR NAME DATE OF BIRTH CALL BACK NUMBER REASON FOR CALL**this is important as we prioritize the call backs  YOU WILL RECEIVE A CALL BACK THE SAME DAY AS LONG AS YOU CALL BEFORE 4:00 PM  At the [facility], you and your health needs are our priority. As part of our continuing mission to provide you with exceptional heart care, we have created designated Provider Care Teams. These Care Teams include your primary Cardiologist (physician) and Advanced Practice Providers (APPs- Physician Assistants and Nurse Practitioners) who all work together to provide you with the care you need, when you need it.   You may see any of the following providers on your designated Care Team at your next follow up: Dr Arvilla Meres Dr Carron Curie, NP Robbie Lis, [place] [facility] [place], [place] Karle Plumber, PharmD   Please be sure to bring in all your medications bottles to every appointment.

## 2021-08-05 LAB — T3, FREE: T3, Free: 2.4 pg/mL (ref 2.0–4.4)

## 2021-08-30 ENCOUNTER — Other Ambulatory Visit: Payer: Self-pay

## 2021-08-30 MED ORDER — SACUBITRIL-VALSARTAN 97-103 MG PO TABS: 1.0000 | ORAL_TABLET | Freq: Two times a day (BID) | ORAL | 3 refills | Status: DC

## 2021-08-30 NOTE — Telephone Encounter (Signed)
Print prescription to submit patient assistance forms.  ?

## 2021-09-08 ENCOUNTER — Other Ambulatory Visit: Payer: Self-pay | Admitting: Cardiology

## 2021-09-12 ENCOUNTER — Other Ambulatory Visit (HOSPITAL_COMMUNITY): Payer: Self-pay

## 2021-09-15 ENCOUNTER — Other Ambulatory Visit (HOSPITAL_COMMUNITY): Payer: Self-pay

## 2021-09-15 ENCOUNTER — Other Ambulatory Visit: Payer: Self-pay

## 2021-09-15 DIAGNOSIS — I5020 Unspecified systolic (congestive) heart failure: Secondary | ICD-10-CM

## 2021-09-15 MED ORDER — SPIRONOLACTONE 25 MG PO TABS: 25.0000 mg | ORAL_TABLET | Freq: Every day | ORAL | 2 refills | Status: DC

## 2021-09-15 MED ORDER — ATORVASTATIN CALCIUM 40 MG PO TABS: ORAL_TABLET | ORAL | 0 refills | Status: DC

## 2021-09-15 MED ORDER — CARVEDILOL 25 MG PO TABS: 25.0000 mg | ORAL_TABLET | Freq: Two times a day (BID) | ORAL | 2 refills | Status: DC

## 2021-09-18 ENCOUNTER — Encounter (HOSPITAL_COMMUNITY): Payer: Self-pay | Admitting: Internal Medicine

## 2021-09-18 ENCOUNTER — Other Ambulatory Visit (HOSPITAL_COMMUNITY): Payer: Self-pay | Admitting: *Deleted

## 2021-09-18 MED ORDER — SACUBITRIL-VALSARTAN 97-103 MG PO TABS: 1.0000 | ORAL_TABLET | Freq: Two times a day (BID) | ORAL | 3 refills | Status: DC

## 2021-09-18 MED ORDER — DAPAGLIFLOZIN PROPANEDIOL 10 MG PO TABS: 10.0000 mg | ORAL_TABLET | Freq: Every day | ORAL | 3 refills | Status: DC

## 2021-09-29 ENCOUNTER — Telehealth: Payer: Self-pay

## 2021-09-29 NOTE — Telephone Encounter (Signed)
Faxed patient assistance form (prescriber application) and received a successful transmission log.  ?

## 2021-10-05 ENCOUNTER — Telehealth: Payer: Self-pay | Admitting: *Deleted

## 2021-10-05 NOTE — Telephone Encounter (Signed)
? ?  Pre-operative Risk Assessment  ?  ?Patient Name: Krista Jenkins  ?DOB: 1961/01/12 ?MRN: [id number]  ? ?  ? ?Request for Surgical Clearance   ? ?Procedure:   LEFT TOTOAL KNEE AROTHRPLASTY ? ?Date of Surgery:  Clearance TBD                              ?   ?Surgeon:  DR. Valma Cava ?Surgeon's Group or Practice Name:  [facility] ?Phone number:  [phone] ?Fax number:  [phone] ATTN:  KERRI MAZE ?  ?Type of Clearance Requested:   ?- Medical  ?  ?Type of Anesthesia:  Spinal WITH ADDUCTOR ?  ?Additional requests/questions:   ? ?Signed, ?Elliot Cousin   ?10/05/2021, 8:21 AM  ? ?

## 2021-10-05 NOTE — Telephone Encounter (Signed)
To [facility] for review ?

## 2021-10-06 NOTE — Telephone Encounter (Signed)
CORRECT FAX # [phone] ?

## 2021-10-06 NOTE — Telephone Encounter (Signed)
? ?  Name: Krista Jenkins  ?DOB: 1961/06/03  ?MRN: [id number]  ? ?Primary Cardiologist: Thomasene Ripple, DO ? ?Chart reviewed as part of pre-operative protocol coverage - pt undergoing left TKA with Dr. Thomasena Edis. ? ?Per Dr. Gala Romney: ?OK to proceed. ? ?I will route this recommendation to the requesting party via Epic fax function and remove from pre-op pool. Please call with questions. ? ?Marcelino Duster, PA ?10/06/2021, 5:39 PM ? ?

## 2021-11-02 NOTE — Progress Notes (Signed)
Sent message, via epic in basket, requesting order in epic from surgeon  ? ? ? 11/02/21 0931  ?Preop Orders  ?Has preop orders? No  ?Name of staff/physician contacted for orders(Indicate phone or IB message) L. Haus, PA-C  ? ? ?

## 2021-11-09 ENCOUNTER — Ambulatory Visit (INDEPENDENT_AMBULATORY_CARE_PROVIDER_SITE_OTHER): Payer: Commercial Managed Care - HMO | Admitting: Cardiology

## 2021-11-09 ENCOUNTER — Encounter: Payer: Self-pay | Admitting: Cardiology

## 2021-11-09 ENCOUNTER — Encounter (HOSPITAL_COMMUNITY)
Admission: RE | Admit: 2021-11-09 | Discharge: 2021-11-09 | Disposition: A | Discharge status: Home / Self Care | Payer: Commercial Managed Care - HMO | Source: Ambulatory Visit | Attending: Family Medicine | Admitting: Family Medicine

## 2021-11-09 VITALS — BP 112/73 | HR 75 | Ht 66.0 in | Wt 180.8 lb

## 2021-11-09 DIAGNOSIS — I428 Other cardiomyopathies: Secondary | ICD-10-CM | POA: Diagnosis not present

## 2021-11-09 DIAGNOSIS — I5020 Unspecified systolic (congestive) heart failure: Secondary | ICD-10-CM | POA: Diagnosis not present

## 2021-11-09 DIAGNOSIS — Z01810 Encounter for preprocedural cardiovascular examination: Secondary | ICD-10-CM | POA: Diagnosis not present

## 2021-11-09 DIAGNOSIS — E782 Mixed hyperlipidemia: Secondary | ICD-10-CM | POA: Diagnosis not present

## 2021-11-09 DIAGNOSIS — R7303 Prediabetes: Secondary | ICD-10-CM

## 2021-11-09 NOTE — Patient Instructions (Signed)
Medication Instructions:  Your physician recommends that you continue on your current medications as directed. Please refer to the Current Medication list given to you today.  *If you need a refill on your cardiac medications before your next appointment, please call your pharmacy*   Lab Work: None If you have labs (blood work) drawn today and your tests are completely normal, you will receive your results only by: MyChart Message (if you have MyChart) OR A paper copy in the mail If you have any lab test that is abnormal or we need to change your treatment, we will call you to review the results.   Testing/Procedures: None   Follow-Up: At [facility], you and your health needs are our priority.  As part of our continuing mission to provide you with exceptional heart care, we have created designated Provider Care Teams.  These Care Teams include your primary Cardiologist (physician) and Advanced Practice Providers (APPs -  Physician Assistants and Nurse Practitioners) who all work together to provide you with the care you need, when you need it.  We recommend signing up for the patient portal called "MyChart".  Sign up information is provided on this After Visit Summary.  MyChart is used to connect with patients for Virtual Visits (Telemedicine).  Patients are able to view lab/test results, encounter notes, upcoming appointments, etc.  Non-urgent messages can be sent to your provider as well.   To learn more about what you can do with MyChart, go to [email or web address].    Your next appointment:   9 month(s)  The format for your next appointment:   In Person  Provider:   Kardie Tobb, DO     Other Instructions   Important Information About Sugar       

## 2021-11-10 NOTE — Progress Notes (Signed)
Cardiology Office Note:    Date:  11/10/2021   ID:  Krista Jenkins, DOB Jun 22, 1961, MRN [id number]  PCP:  Lise Auer, MD  Cardiologist:  Thomasene Ripple, DO  Electrophysiologist:  None   Referring MD: Lise Auer, MD   " I am doing fine"  History of Present Illness:    Krista Jenkins is a 61 y.o. female with a hx of nonischemic cardiomyopathy her recent EF has improved is now 40 to 45% from echo done in November 2021 1 prior EF was 20 to 25% in July 2021, chronic heart failure with reduced ejection fraction, hyperlipidemia presents today for a follow-up visit.  I saw the patient on 04/2021 at that time she was doing well - no meds changes were made.   Since I saw that patient she has seen the heart failure team. No med changes were made.   Today she offers no complaints at this time. She tells me of her upcoming surgery.   Past Medical History:  Diagnosis Date   ACC/AHA stage C systolic heart failure (HCC) 08/21/2019   Acute systolic CHF (congestive heart failure) (HCC) 06/10/2019   Asthma 05/16/2018   Cervical radiculopathy 06/27/2018   Chronic back pain 05/16/2018   Degeneration of lumbar intervertebral disc 06/27/2018   Depressed left ventricular ejection fraction    Heart failure with reduced ejection fraction (HCC)    Hyperlipidemia    Lumbar radiculopathy 06/28/2018   Mixed hyperlipidemia 08/21/2019   Neck pain 05/16/2018   Nonischemic cardiomyopathy (HCC)    NYHA class 3 heart failure with reduced ejection fraction (HCC) 08/21/2019   Scoliosis deformity of spine 07/17/2018    Past Surgical History:  Procedure Laterality Date   CESAREAN SECTION     RIGHT/LEFT HEART CATH AND CORONARY ANGIOGRAPHY N/A 06/11/2019   Procedure: RIGHT/LEFT HEART CATH AND CORONARY ANGIOGRAPHY;  Surgeon: Corky Crafts, MD;  Location: [facility];  Service: Cardiovascular;  Laterality: N/A;    Current Medications: Current Meds  Medication Sig   amitriptyline (ELAVIL) 25 MG tablet  Take 50 mg by mouth at bedtime.   atorvastatin (LIPITOR) 40 MG tablet TAKE 1 TABLET BY MOUTH DAILY AT 6PM.   buPROPion (WELLBUTRIN XL) 150 MG 24 hr tablet bupropion HCl XL 150 mg 24 hr tablet, extended release   carvedilol (COREG) 25 MG tablet Take 1 tablet (25 mg total) by mouth 2 (two) times daily with a meal.   citalopram (CELEXA) 10 MG tablet Take 10 mg by mouth daily.   dapagliflozin propanediol (FARXIGA) 10 MG TABS tablet Take 1 tablet (10 mg total) by mouth daily before breakfast.   diphenhydramine-acetaminophen (TYLENOL PM) 25-500 MG TABS tablet Take 1 tablet by mouth at bedtime as needed (for pain).   sacubitril-valsartan (ENTRESTO) 97-103 MG Take 1 tablet by mouth 2 (two) times daily.   spironolactone (ALDACTONE) 25 MG tablet Take 1 tablet (25 mg total) by mouth daily.     Allergies:   Latex   Social History   Socioeconomic History   Marital status: Unknown    Spouse name: Not on file   Number of children: Not on file   Years of education: Not on file   Highest education level: Not on file  Occupational History   Not on file  Tobacco Use   Smoking status: Former   Smokeless tobacco: Never  Substance and Sexual Activity   Alcohol use: Not Currently   Drug use: Not on file   Sexual activity: Not on file  Other Topics Concern   Not on file  Social History Narrative   Not on file   Social Determinants of Health   Financial Resource Strain: Not on file  Food Insecurity: Not on file  Transportation Needs: Not on file  Physical Activity: Not on file  Stress: Not on file  Social Connections: Not on file     Family History: The patient's family history includes CAD in her father; Hypertension in her brother.  ROS:   Review of Systems  Constitution: Negative for decreased appetite, fever and weight gain.  HENT: Negative for congestion, ear discharge, hoarse voice and sore throat.   Eyes: Negative for discharge, redness, vision loss in right eye and visual halos.   Cardiovascular: Negative for chest pain, dyspnea on exertion, leg swelling, orthopnea and palpitations.  Respiratory: Negative for cough, hemoptysis, shortness of breath and snoring.   Endocrine: Negative for heat intolerance and polyphagia.  Hematologic/Lymphatic: Negative for bleeding problem. Does not bruise/bleed easily.  Skin: Negative for flushing, nail changes, rash and suspicious lesions.  Musculoskeletal: Negative for arthritis, joint pain, muscle cramps, myalgias, neck pain and stiffness.  Gastrointestinal: Negative for abdominal pain, bowel incontinence, diarrhea and excessive appetite.  Genitourinary: Negative for decreased libido, genital sores and incomplete emptying.  Neurological: Negative for brief paralysis, focal weakness, headaches and loss of balance.  Psychiatric/Behavioral: Negative for altered mental status, depression and suicidal ideas.  Allergic/Immunologic: Negative for HIV exposure and persistent infections.    EKGs/Labs/Other Studies Reviewed:    The following studies were reviewed today:   EKG:  None today   TTE IMPRESSION  1. Left ventricular ejection fraction, by estimation, is 40 to 45%. The  left ventricle has mildly decreased function. The left ventricle has no  regional wall motion abnormalities. Left ventricular diastolic parameters  are consistent with Grade I  diastolic dysfunction (impaired relaxation).   2. Right ventricular systolic function is normal. The right ventricular  size is normal.   3. Left atrial size was mildly dilated.   4. The mitral valve is normal in structure. Trivial mitral valve  regurgitation. No evidence of mitral stenosis.   5. The aortic valve is normal in structure. Aortic valve regurgitation is  not visualized. No aortic stenosis is present.   6. The inferior vena cava is normal in size with greater than 50%  respiratory variability, suggesting right atrial pressure of 3 mmHg.   FINDINGS   Left Ventricle: Left  ventricular ejection fraction, by estimation, is 40  to 45%. The left ventricle has mildly decreased function. The left  ventricle has no regional wall motion abnormalities. The left ventricular  internal cavity size was normal in  size. There is no left ventricular hypertrophy. Left ventricular diastolic  parameters are consistent with Grade I diastolic dysfunction (impaired  relaxation).   Right Ventricle: The right ventricular size is normal. No increase in  right ventricular wall thickness. Right ventricular systolic function is  normal.   Left Atrium: Left atrial size was mildly dilated.   Right Atrium: Right atrial size was normal in size.   Pericardium: There is no evidence of pericardial effusion.   Mitral Valve: The mitral valve is normal in structure. Trivial mitral  valve regurgitation. No evidence of mitral valve stenosis.   Tricuspid Valve: The tricuspid valve is normal in structure. Tricuspid  valve regurgitation is trivial. No evidence of tricuspid stenosis.   Aortic Valve: The aortic valve is normal in structure. Aortic valve  regurgitation is not visualized. No aortic  stenosis is present.   Pulmonic Valve: The pulmonic valve was normal in structure. Pulmonic valve regurgitation is trivial. No evidence of pulmonic stenosis.   Aorta: The aortic root is normal in size and structure.   Venous: The inferior vena cava is normal in size with greater than 50% respiratory variability, suggesting right atrial pressure of 3 mmHg.   IAS/Shunts: No atrial level shunt detected by color flow Doppler.     Recent Labs: 05/05/2021: Magnesium 2.2 08/04/2021: ALT 22; BUN 31; Creatinine, Ser 0.87; Hemoglobin 13.3; Platelets 227; Potassium 4.2; Sodium 135; TSH 2.125  Recent Lipid Panel No results found for: CHOL, TRIG, HDL, CHOLHDL, VLDL, LDLCALC, LDLDIRECT  Physical Exam:    VS:  BP 112/73   Pulse 75   Ht  (1.676 m)   Wt 180 lb 12.8 oz (82 kg)   LMP  (LMP Unknown)    SpO2 98%   BMI 29.18 kg/m     Wt Readings from Last 3 Encounters:  11/09/21 180 lb 12.8 oz (82 kg)  08/04/21 190 lb 9.6 oz (86.5 kg)  05/05/21 197 lb 12.8 oz (89.7 kg)     GEN: Well nourished, well developed in no acute distress HEENT: Normal NECK: No JVD; No carotid bruits LYMPHATICS: No lymphadenopathy CARDIAC: S1S2 noted,RRR, no murmurs, rubs, gallops RESPIRATORY:  Clear to auscultation without rales, wheezing or rhonchi  ABDOMEN: Soft, non-tender, non-distended, +bowel sounds, no guarding. EXTREMITIES: No edema, No cyanosis, no clubbing MUSCULOSKELETAL:  No deformity  SKIN: Warm and dry NEUROLOGIC:  Alert and oriented x 3, non-focal PSYCHIATRIC:  Normal affect, good insight  ASSESSMENT:    1. Preoperative cardiovascular examination   2. Heart failure with reduced ejection fraction (HCC)   3. Nonischemic cardiomyopathy (HCC)   4. Mixed hyperlipidemia   5. Prediabetes    PLAN:    The patient does not have any unstable cardiac conditions.  Upon evaluation today,she  can achieve 4 METs or greater without anginal symptoms.  According to [facility] and AHA guidelines, she requires no further cardiac workup prior to his noncardiac surgery and should be at acceptable risk.  Our service is available as necessary in the perioperative period.  Hfref - continue on current medication regimen.  HTN- stable, bp acceptable - no med change.  HLN - continue on Lipitor  Obesity - continues to work on weigh loss.    The patient is in agreement with the above plan. The patient left the office in stable condition.  The patient will follow up in 9 months.   Medication Adjustments/Labs and Tests Ordered: Current medicines are reviewed at length with the patient today.  Concerns regarding medicines are outlined above.  Orders Placed This Encounter  Procedures   EKG 12-Lead   No orders of the defined types were placed in this encounter.   Patient Instructions  Medication Instructions:   Your physician recommends that you continue on your current medications as directed. Please refer to the Current Medication list given to you today.  *If you need a refill on your cardiac medications before your next appointment, please call your pharmacy*   Lab Work: None If you have labs (blood work) drawn today and your tests are completely normal, you will receive your results only by: MyChart Message (if you have MyChart) OR A paper copy in the mail If you have any lab test that is abnormal or we need to change your treatment, we will call you to review the results.   Testing/Procedures: None  Follow-Up: At [facility], you and your health needs are our priority.  As part of our continuing mission to provide you with exceptional heart care, we have created designated Provider Care Teams.  These Care Teams include your primary Cardiologist (physician) and Advanced Practice Providers (APPs -  Physician Assistants and Nurse Practitioners) who all work together to provide you with the care you need, when you need it.  We recommend signing up for the patient portal called "MyChart".  Sign up information is provided on this After Visit Summary.  MyChart is used to connect with patients for Virtual Visits (Telemedicine).  Patients are able to view lab/test results, encounter notes, upcoming appointments, etc.  Non-urgent messages can be sent to your provider as well.   To learn more about what you can do with MyChart, go to [email or web address].    Your next appointment:   9 month(s)  The format for your next appointment:   In Person  Provider:   Thomasene Ripple, DO    Other Instructions   Important Information About Sugar         Adopting a Healthy Lifestyle.  Know what a healthy weight is for you (roughly BMI <25) and aim to maintain this   Aim for 7+ servings of fruits and vegetables daily   65-80+ fluid ounces of water or unsweet tea for healthy kidneys    Limit to max 1 drink of alcohol per day; avoid smoking/tobacco   Limit animal fats in diet for cholesterol and heart health - choose grass fed whenever available   Avoid highly processed foods, and foods high in saturated/trans fats   Aim for low stress - take time to unwind and care for your mental health   Aim for 150 min of moderate intensity exercise weekly for heart health, and weights twice weekly for bone health   Aim for 7-9 hours of sleep daily   When it comes to diets, agreement about the perfect plan isnt easy to find, even among the experts. Experts at the [facility] [employer] developed an idea known as the Healthy Eating Plate. Just imagine a plate divided into logical, healthy portions.   The emphasis is on diet quality:   Load up on vegetables and fruits - one-half of your plate: Aim for color and variety, and remember that potatoes dont count.   Go for whole grains - one-quarter of your plate: Whole wheat, barley, wheat berries, quinoa, oats, brown rice, and foods made with them. If you want pasta, go with whole wheat pasta.   Protein power - one-quarter of your plate: Fish, chicken, beans, and nuts are all healthy, versatile protein sources. Limit red meat.   The diet, however, does go beyond the plate, offering a few other suggestions.   Use healthy plant oils, such as olive, canola, soy, corn, sunflower and peanut. Check the labels, and avoid partially hydrogenated oil, which have unhealthy trans fats.   If youre thirsty, drink water. Coffee and tea are good in moderation, but skip sugary drinks and limit milk and dairy products to one or two daily servings.   The type of carbohydrate in the diet is more important than the amount. Some sources of carbohydrates, such as vegetables, fruits, whole grains, and beans-are healthier than others.   Finally, stay active  Signed, Thomasene Ripple, DO  11/10/2021 9:26 PM    [facility]

## 2021-11-15 DIAGNOSIS — M1712 Unilateral primary osteoarthritis, left knee: Secondary | ICD-10-CM

## 2021-12-11 ENCOUNTER — Other Ambulatory Visit (HOSPITAL_COMMUNITY): Payer: Self-pay | Admitting: *Deleted

## 2021-12-11 ENCOUNTER — Encounter (HOSPITAL_COMMUNITY): Payer: Self-pay | Admitting: Internal Medicine

## 2021-12-11 DIAGNOSIS — I5020 Unspecified systolic (congestive) heart failure: Secondary | ICD-10-CM

## 2021-12-11 MED ORDER — SACUBITRIL-VALSARTAN 97-103 MG PO TABS: 1.0000 | ORAL_TABLET | Freq: Two times a day (BID) | ORAL | 3 refills | Status: DC

## 2021-12-11 MED ORDER — SACUBITRIL-VALSARTAN 97-103 MG PO TABS: 1.0000 | ORAL_TABLET | Freq: Two times a day (BID) | ORAL | 0 refills | Status: DC

## 2021-12-19 NOTE — Progress Notes (Addendum)
Anesthesia Review:  PCP: DR Welton Flakes at [facility] in [place]  Have requested clearance from [place] at [facility].   Clearance dated 10/23/21 on chart  Cardiologist : Kardie Tobb- [facility] 11/09/21  DR Bensimhon  L[facility] 08/04/21 - clearance on chart dated 10/06/2021  Chest x-ray : EKG : 11/09/21  Echo : 02/14/21  Stress test: Cardiac Cath :  2020  Activity level: can do a flgiht of stairs without difficulty  Sleep Study/ CPAP : none  Fasting Blood Sugar :      / Checks Blood Sugar -- times a day:   Blood Thinner/ Instructions /Last Dose: ASA / Instructions/ Last Dose :   DM- prediabetes- does not check glucose at home  Hgba1c-  12/21/21-  Spoke with Shanda Bumps WArd ,PA in regards to Albia ( used mainly as heart failure drug for pt ) PT will hold dose day before surgery and none day of surgery per Leticia Clas.

## 2021-12-21 ENCOUNTER — Encounter (HOSPITAL_COMMUNITY)
Admission: RE | Admit: 2021-12-21 | Discharge: 2021-12-21 | Disposition: A | Discharge status: Home / Self Care | Payer: Commercial Managed Care - HMO | Source: Ambulatory Visit | Attending: Specialist | Admitting: Specialist

## 2021-12-21 ENCOUNTER — Encounter (HOSPITAL_COMMUNITY): Payer: Self-pay

## 2021-12-21 ENCOUNTER — Other Ambulatory Visit: Payer: Self-pay

## 2021-12-21 VITALS — BP 109/77 | HR 75 | Temp 98.4°F | Resp 16 | Ht 65.0 in | Wt 183.0 lb

## 2021-12-21 DIAGNOSIS — I509 Heart failure, unspecified: Secondary | ICD-10-CM | POA: Insufficient documentation

## 2021-12-21 DIAGNOSIS — Z87891 Personal history of nicotine dependence: Secondary | ICD-10-CM | POA: Diagnosis not present

## 2021-12-21 DIAGNOSIS — J45909 Unspecified asthma, uncomplicated: Secondary | ICD-10-CM | POA: Insufficient documentation

## 2021-12-21 DIAGNOSIS — M1712 Unilateral primary osteoarthritis, left knee: Secondary | ICD-10-CM | POA: Diagnosis not present

## 2021-12-21 DIAGNOSIS — Z01812 Encounter for preprocedural laboratory examination: Secondary | ICD-10-CM | POA: Diagnosis present

## 2021-12-21 DIAGNOSIS — Z01818 Encounter for other preprocedural examination: Secondary | ICD-10-CM

## 2021-12-21 HISTORY — DX: Anxiety disorder, unspecified: F41.9

## 2021-12-21 HISTORY — DX: Depression, unspecified: F32.A

## 2021-12-21 HISTORY — DX: Pneumonia, unspecified organism: J18.9

## 2021-12-21 LAB — HEMOGLOBIN A1C
Hgb A1c MFr Bld: 5.9 % — ABNORMAL HIGH (ref 4.8–5.6)
Mean Plasma Glucose: 123 mg/dL

## 2021-12-21 LAB — COMPREHENSIVE METABOLIC PANEL
ALT: 17 U/L (ref 0–44)
AST: 17 U/L (ref 15–41)
Albumin: 4.2 g/dL (ref 3.5–5.0)
Alkaline Phosphatase: 96 U/L (ref 38–126)
Anion gap: 6 (ref 5–15)
BUN: 14 mg/dL (ref 6–20)
CO2: 30 mmol/L (ref 22–32)
Calcium: 9.3 mg/dL (ref 8.9–10.3)
Chloride: 103 mmol/L (ref 98–111)
Creatinine, Ser: 0.75 mg/dL (ref 0.44–1.00)
GFR, Estimated: >60 mL/min (ref >60)
Glucose, Bld: 115 mg/dL — ABNORMAL HIGH (ref 70–99)
Potassium: 4.6 mmol/L (ref 3.5–5.1)
Sodium: 139 mmol/L (ref 135–145)
Total Bilirubin: 0.4 mg/dL (ref 0.3–1.2)
Total Protein: 7.9 g/dL (ref 6.5–8.1)

## 2021-12-21 LAB — CBC
HCT: 42.5 % (ref 36.0–46.0)
Hemoglobin: 13.8 g/dL (ref 12.0–15.0)
MCH: 30.5 pg (ref 26.0–34.0)
MCHC: 32.5 g/dL (ref 30.0–36.0)
MCV: 93.8 fL (ref 80.0–100.0)
Platelets: 252 10*3/uL (ref 150–400)
RBC: 4.53 MIL/uL (ref 3.87–5.11)
RDW: 13.2 % (ref 11.5–15.5)
WBC: 4.5 10*3/uL (ref 4.0–10.5)
nRBC: 0 % (ref 0.0–0.2)

## 2021-12-21 LAB — SURGICAL PCR SCREEN
MRSA, PCR: NEGATIVE
Staphylococcus aureus: NEGATIVE

## 2021-12-21 LAB — TYPE AND SCREEN
ABO/RH(D): A POS
Antibody Screen: NEGATIVE

## 2021-12-21 LAB — GLUCOSE, CAPILLARY: Glucose-Capillary: 118 mg/dL — ABNORMAL HIGH (ref 70–99)

## 2021-12-22 NOTE — Progress Notes (Signed)
Anesthesia Chart Review   Case: [id number] Date/Time: 01/03/22 0815   Procedure: TOTAL KNEE ARTHROPLASTY (Left: Knee) - adductor canal 120   Anesthesia type: Spinal   Pre-op diagnosis: Left knee osteoarthritis   Location: [facility] ROOM 08 / [facility]   Surgeons: Eugenia Mcalpine, MD       DISCUSSION:61 y.o. former smoker with h/o asthma, CHF, left knee OA scheduled for above procedure 01/03/2022 with Dr. Eugenia Mcalpine.   Pt last seen by cardiology 11/09/2021. Per OV note, "The patient does not have any unstable cardiac conditions.  Upon evaluation today,she  can achieve 4 METs or greater without anginal symptoms.  According to [facility] and AHA guidelines, she requires no further cardiac workup prior to his noncardiac surgery and should be at acceptable risk.  Our service is available as necessary in the perioperative period."  Anticipate pt can proceed with planned procedure barring acute status change.   VS: BP 109/77   Pulse 75   Temp 36.9 C (Oral)   Resp 16   Ht 5\' 5"  (1.651 m)   Wt 83 kg   LMP  (LMP Unknown)   SpO2 97%   BMI 30.45 kg/m   PROVIDERS: , MD is PCP   Lise Auer, DO is Cardiologist  LABS: Labs reviewed: Acceptable for surgery. (all labs ordered are listed, but only abnormal results are displayed)  Labs Reviewed  HEMOGLOBIN A1C - Abnormal; Notable for the following components:      Result Value   Hgb A1c MFr Bld 5.9 (*)    All other components within normal limits  COMPREHENSIVE METABOLIC PANEL - Abnormal; Notable for the following components:   Glucose, Bld 115 (*)    All other components within normal limits  GLUCOSE, CAPILLARY - Abnormal; Notable for the following components:   Glucose-Capillary 118 (*)    All other components within normal limits  SURGICAL PCR SCREEN  CBC  TYPE AND SCREEN     IMAGES:   EKG: 11/09/2021 Rate 75 bpm  NSR Incomplete RBBB ST&T wave abnormality, consider inferior ischemia ST&T wave abnormality, consider  anterolateral ischemia   CV: Echo 02/14/2021 1. Left ventricular ejection fraction, by estimation, is 40 to 45%. The  left ventricle has mildly decreased function. The left ventricle has no  regional wall motion abnormalities. Left ventricular diastolic parameters  are consistent with Grade I  diastolic dysfunction (impaired relaxation).   2. Right ventricular systolic function is normal. The right ventricular  size is normal.   3. Left atrial size was mildly dilated.   4. The mitral valve is normal in structure. Trivial mitral valve  regurgitation. No evidence of mitral stenosis.   5. The aortic valve is normal in structure. Aortic valve regurgitation is  not visualized. No aortic stenosis is present.   6. The inferior vena cava is normal in size with greater than 50%  respiratory variability, suggesting right atrial pressure of 3 mmHg.   Cardiac Cath 06/11/2019 There is severe left ventricular systolic dysfunction. The left ventricular ejection fraction is less than 25% by visual estimate. LV end diastolic pressure is mildly elevated. LVEDP 20 mm Hg. There is no aortic valve stenosis. Ao sat 94%, PA sat 66%, mean PA pressure 18 mm Hg, mean PCWP 15 mm Hg; CO 4.9 L/min; CI 2.5 No angiographically apparent CAD.   Nonischemic cardiomyopathy.  Continue aggressive medical therapy.  Past Medical History:  Diagnosis Date   ACC/AHA stage C systolic heart failure (HCC) 08/21/2019   Acute systolic  CHF (congestive heart failure) (HCC) 06/10/2019   Anxiety    Asthma 05/16/2018   Chronic back pain 05/16/2018   Degeneration of lumbar intervertebral disc 06/27/2018   Depressed left ventricular ejection fraction    Depression    Heart failure with reduced ejection fraction (HCC)    Hyperlipidemia    Mixed hyperlipidemia 08/21/2019   Neck pain 05/16/2018   Nonischemic cardiomyopathy (HCC)    NYHA class 3 heart failure with reduced ejection fraction (HCC) 08/21/2019   Pneumonia     Scoliosis deformity of spine 07/17/2018    Past Surgical History:  Procedure Laterality Date   CESAREAN SECTION     RIGHT/LEFT HEART CATH AND CORONARY ANGIOGRAPHY N/A 06/11/2019   Procedure: RIGHT/LEFT HEART CATH AND CORONARY ANGIOGRAPHY;  Surgeon: Corky Crafts, MD;  Location: [facility];  Service: Cardiovascular;  Laterality: N/A;    MEDICATIONS:  albuterol (VENTOLIN HFA) 108 (90 Base) MCG/ACT inhaler   amitriptyline (ELAVIL) 50 MG tablet   atorvastatin (LIPITOR) 40 MG tablet   buPROPion (WELLBUTRIN XL) 150 MG 24 hr tablet   carvedilol (COREG) 25 MG tablet   citalopram (CELEXA) 10 MG tablet   Coenzyme Q10 (COQ-10 PO)   dapagliflozin propanediol (FARXIGA) 10 MG TABS tablet   diphenhydramine-acetaminophen (TYLENOL PM) 25-500 MG TABS tablet   sacubitril-valsartan (ENTRESTO) 97-103 MG   spironolactone (ALDACTONE) 25 MG tablet   No current facility-administered medications for this encounter.    Jodell Cipro Ward, PA-C [facility] [phone]

## 2022-01-01 NOTE — H&P (Signed)
TOTAL KNEE ADMISSION H&P  Patient is being admitted for left total knee arthroplasty.  Subjective:  Chief Complaint:left knee pain.  HPI: Krista Jenkins, 61 y.o. female, has a history of pain and functional disability in the left knee due to arthritis and has failed non-surgical conservative treatments for greater than 12 weeks to includeNSAID's and/or analgesics, corticosteriod injections, flexibility and strengthening excercises, and activity modification.  Onset of symptoms was gradual, starting 2 years ago with gradually worsening course since that time. The patient noted no past surgery on the left knee(s).  Patient currently rates pain in the left knee(s) at 7 out of 10 with activity. Patient has night pain, worsening of pain with activity and weight bearing, and pain that interferes with activities of daily living.  Patient has evidence of subchondral sclerosis, periarticular osteophytes, and joint space narrowing by imaging studies. This patient has had  No previous injury . There is no active infection.  Patient Active Problem List   Diagnosis Date Noted   Osteoarthritis of left knee 04/04/2021   Obesity (BMI 30-39.9) 08/16/2020   Nonischemic cardiomyopathy (HCC)    Hyperlipidemia    ACC/AHA stage C systolic heart failure (HCC) 08/21/2019   Mixed hyperlipidemia 08/21/2019   Heart failure with reduced ejection fraction (HCC) 08/21/2019   NYHA class 3 heart failure with reduced ejection fraction (HCC) 08/21/2019   Depressed left ventricular ejection fraction 08/21/2019   Acute systolic CHF (congestive heart failure) (HCC) 06/10/2019   Scoliosis deformity of spine 07/17/2018   Lumbar radiculopathy 06/28/2018   Cervical radiculopathy 06/27/2018   Degeneration of lumbar intervertebral disc 06/27/2018   Chronic back pain 05/16/2018   Asthma 05/16/2018   Neck pain 05/16/2018   Past Medical History:  Diagnosis Date   ACC/AHA stage C systolic heart failure (HCC) 08/21/2019   Acute  systolic CHF (congestive heart failure) (HCC) 06/10/2019   Anxiety    Asthma 05/16/2018   Chronic back pain 05/16/2018   Degeneration of lumbar intervertebral disc 06/27/2018   Depressed left ventricular ejection fraction    Depression    Heart failure with reduced ejection fraction (HCC)    Hyperlipidemia    Mixed hyperlipidemia 08/21/2019   Neck pain 05/16/2018   Nonischemic cardiomyopathy (HCC)    NYHA class 3 heart failure with reduced ejection fraction (HCC) 08/21/2019   Pneumonia    Scoliosis deformity of spine 07/17/2018    Past Surgical History:  Procedure Laterality Date   CESAREAN SECTION     RIGHT/LEFT HEART CATH AND CORONARY ANGIOGRAPHY N/A 06/11/2019   Procedure: RIGHT/LEFT HEART CATH AND CORONARY ANGIOGRAPHY;  Surgeon: Corky Crafts, MD;  Location: [facility];  Service: Cardiovascular;  Laterality: N/A;    No current facility-administered medications for this encounter.   Current Outpatient Medications  Medication Sig Dispense Refill Last Dose   albuterol (VENTOLIN HFA) 108 (90 Base) MCG/ACT inhaler Inhale 2 puffs into the lungs every 6 (six) hours as needed for wheezing or shortness of breath.      amitriptyline (ELAVIL) 50 MG tablet Take 100 mg by mouth at bedtime.      atorvastatin (LIPITOR) 40 MG tablet TAKE 1 TABLET BY MOUTH DAILY AT 6PM. 90 tablet 0    buPROPion (WELLBUTRIN XL) 150 MG 24 hr tablet Take 150 mg by mouth daily.      carvedilol (COREG) 25 MG tablet Take 1 tablet (25 mg total) by mouth 2 (two) times daily with a meal. 180 tablet 2    citalopram (CELEXA) 10 MG  tablet Take 10 mg by mouth daily.      dapagliflozin propanediol (FARXIGA) 10 MG TABS tablet Take 1 tablet (10 mg total) by mouth daily before breakfast. 90 tablet 3    diphenhydramine-acetaminophen (TYLENOL PM) 25-500 MG TABS tablet Take 2 tablets by mouth at bedtime as needed (for pain).      sacubitril-valsartan (ENTRESTO) 97-103 MG Take 1 tablet by mouth 2 (two) times daily.  Temporary script until mail order arrives. 60 tablet 0    spironolactone (ALDACTONE) 25 MG tablet Take 1 tablet (25 mg total) by mouth daily. 90 tablet 2    Coenzyme Q10 (COQ-10 PO) Take 1 tablet by mouth daily.      Allergies  Allergen Reactions   Latex Rash    Social History   Tobacco Use   Smoking status: Former   Smokeless tobacco: Never  Substance Use Topics   Alcohol use: Never    Family History  Problem Relation Age of Onset   CAD Father    Hypertension Brother      Review of Systems  All other systems reviewed and are negative.   Objective:  Physical Exam Constitutional:      Appearance: Normal appearance.  HENT:     Head: Normocephalic and atraumatic.  Cardiovascular:     Rate and Rhythm: Normal rate and regular rhythm.     Pulses: Normal pulses.     Heart sounds: Normal heart sounds.  Pulmonary:     Effort: Pulmonary effort is normal.     Breath sounds: Normal breath sounds.  Musculoskeletal:        General: Swelling, tenderness (medial and lateral joint line) and deformity (varus) present.  Skin:    General: Skin is warm and dry.     Capillary Refill: Capillary refill takes less than 2 seconds.  Neurological:     General: No focal deficit present.     Mental Status: She is alert and oriented to person, place, and time.     Vital signs in last 24 hours:    Labs:   Estimated body mass index is 30.45 kg/m as calculated from the following:   Height as of 12/21/21: 5\' 5"  (1.651 m).   Weight as of 12/21/21: 83 kg.   Imaging Review Plain radiographs demonstrate severe degenerative joint disease of the left knee(s). The overall alignment ismild varus. The bone quality appears to be good for age and reported activity level.      Assessment/Plan:  End stage arthritis, left knee   The patient history, physical examination, clinical judgment of the provider and imaging studies are consistent with end stage degenerative joint disease of the left  knee(s) and total knee arthroplasty is deemed medically necessary. The treatment options including medical management, injection therapy arthroscopy and arthroplasty were discussed at length. The risks and benefits of total knee arthroplasty were presented and reviewed. The risks due to aseptic loosening, infection, stiffness, patella tracking problems, thromboembolic complications and other imponderables were discussed. The patient acknowledged the explanation, agreed to proceed with the plan and consent was signed. Patient is being admitted for inpatient treatment for surgery, pain control, PT, OT, prophylactic antibiotics, VTE prophylaxis, progressive ambulation and ADL's and discharge planning. The patient is planning to be discharged  home with outpatient PT     Patient's anticipated LOS is less than 2 midnights, meeting these requirements: - Younger than 36 - Lives within 1 hour of care - Has a competent adult at home to recover with post-op recover -  NO history of  - Chronic pain requiring opiods  - Diabetes  - Coronary Artery Disease  - Heart failure  - Heart attack  - Stroke  - DVT/VTE  - Cardiac arrhythmia  - Respiratory Failure/COPD  - Renal failure  - Anemia  - Advanced Liver disease

## 2022-01-02 NOTE — Anesthesia Preprocedure Evaluation (Signed)
Anesthesia Evaluation  Patient identified by MRN, date of birth, ID band Patient awake    Reviewed: Allergy & Precautions, NPO status , Patient's Chart, lab work & pertinent test results, reviewed documented beta blocker date and time   Airway Mallampati: II  TM Distance: >3 FB Neck ROM: Full    Dental  (+) Teeth Intact, Dental Advisory Given, Caps,    Pulmonary asthma , former smoker,    Pulmonary exam normal breath sounds clear to auscultation       Cardiovascular hypertension, Pt. on home beta blockers and Pt. on medications (-) angina+CHF  (-) Past MI and (-) Cardiac Stents Normal cardiovascular exam Rhythm:Regular Rate:Normal  Echo 02/14/21: 1. Left ventricular ejection fraction, by estimation, is 40 to 45%. The  left ventricle has mildly decreased function. The left ventricle has no  regional wall motion abnormalities. Left ventricular diastolic parameters  are consistent with Grade I  diastolic dysfunction (impaired relaxation).  2. Right ventricular systolic function is normal. The right ventricular  size is normal.  3. Left atrial size was mildly dilated.  4. The mitral valve is normal in structure. Trivial mitral valve  regurgitation. No evidence of mitral stenosis.  5. The aortic valve is normal in structure. Aortic valve regurgitation is  not visualized. No aortic stenosis is present.  6. The inferior vena cava is normal in size with greater than 50%  respiratory variability, suggesting right atrial pressure of 3 mmHg.    Neuro/Psych PSYCHIATRIC DISORDERS Anxiety Depression  Neuromuscular disease    GI/Hepatic negative GI ROS, Neg liver ROS,   Endo/Other  Obesity   Renal/GU negative Renal ROS     Musculoskeletal  (+) Arthritis ,   Abdominal   Peds  Hematology Plt 252k   Anesthesia Other Findings   Reproductive/Obstetrics                            Anesthesia  Physical Anesthesia Plan  ASA: 3  Anesthesia Plan: Spinal   Post-op Pain Management: Regional block* and Tylenol PO (pre-op)*   Induction: Intravenous  PONV Risk Score and Plan: 2 and TIVA and Treatment may vary due to age or medical condition  Airway Management Planned: Natural Airway and Nasal Cannula  Additional Equipment:   Intra-op Plan:   Post-operative Plan:   Informed Consent: I have reviewed the patients History and Physical, chart, labs and discussed the procedure including the risks, benefits and alternatives for the proposed anesthesia with the patient or authorized representative who has indicated his/her understanding and acceptance.     Dental advisory given  Plan Discussed with: CRNA  Anesthesia Plan Comments:        Anesthesia Quick Evaluation

## 2022-01-03 ENCOUNTER — Observation Stay (HOSPITAL_COMMUNITY)
Admission: RE | Admit: 2022-01-03 | Discharge: 2022-01-05 | Disposition: A | Discharge status: Home / Self Care | Payer: Commercial Managed Care - HMO | Attending: Specialist | Admitting: Specialist

## 2022-01-03 ENCOUNTER — Encounter (HOSPITAL_COMMUNITY)
Admission: RE | Disposition: A | Discharge status: Home / Self Care | Payer: Self-pay | Source: Home / Self Care | Attending: Specialist

## 2022-01-03 ENCOUNTER — Other Ambulatory Visit: Payer: Self-pay

## 2022-01-03 ENCOUNTER — Ambulatory Visit (HOSPITAL_COMMUNITY): Payer: Commercial Managed Care - HMO | Admitting: Physician Assistant

## 2022-01-03 ENCOUNTER — Ambulatory Visit (HOSPITAL_BASED_OUTPATIENT_CLINIC_OR_DEPARTMENT_OTHER): Payer: Commercial Managed Care - HMO | Admitting: Certified Registered Nurse Anesthetist

## 2022-01-03 ENCOUNTER — Encounter (HOSPITAL_COMMUNITY): Payer: Self-pay | Admitting: Specialist

## 2022-01-03 DIAGNOSIS — Z87891 Personal history of nicotine dependence: Secondary | ICD-10-CM

## 2022-01-03 DIAGNOSIS — I5021 Acute systolic (congestive) heart failure: Secondary | ICD-10-CM | POA: Insufficient documentation

## 2022-01-03 DIAGNOSIS — Z91040 Latex allergy status: Secondary | ICD-10-CM | POA: Diagnosis not present

## 2022-01-03 DIAGNOSIS — Z79899 Other long term (current) drug therapy: Secondary | ICD-10-CM | POA: Diagnosis not present

## 2022-01-03 DIAGNOSIS — I5020 Unspecified systolic (congestive) heart failure: Secondary | ICD-10-CM

## 2022-01-03 DIAGNOSIS — J45909 Unspecified asthma, uncomplicated: Secondary | ICD-10-CM | POA: Diagnosis not present

## 2022-01-03 DIAGNOSIS — Z01818 Encounter for other preprocedural examination: Secondary | ICD-10-CM

## 2022-01-03 DIAGNOSIS — Z7951 Long term (current) use of inhaled steroids: Secondary | ICD-10-CM | POA: Diagnosis not present

## 2022-01-03 DIAGNOSIS — I110 Hypertensive heart disease with heart failure: Secondary | ICD-10-CM | POA: Diagnosis not present

## 2022-01-03 DIAGNOSIS — M1712 Unilateral primary osteoarthritis, left knee: Principal | ICD-10-CM | POA: Diagnosis present

## 2022-01-03 HISTORY — PX: TOTAL KNEE ARTHROPLASTY: SHX125

## 2022-01-03 LAB — CBC
HCT: 41.4 % (ref 36.0–46.0)
Hemoglobin: 13.2 g/dL (ref 12.0–15.0)
MCH: 30 pg (ref 26.0–34.0)
MCHC: 31.9 g/dL (ref 30.0–36.0)
MCV: 94.1 fL (ref 80.0–100.0)
Platelets: 196 10*3/uL (ref 150–400)
RBC: 4.4 MIL/uL (ref 3.87–5.11)
RDW: 13.3 % (ref 11.5–15.5)
WBC: 5.1 10*3/uL (ref 4.0–10.5)
nRBC: 0 % (ref 0.0–0.2)

## 2022-01-03 LAB — CREATININE, SERUM
Creatinine, Ser: 0.83 mg/dL (ref 0.44–1.00)
GFR, Estimated: >60 mL/min (ref >60)

## 2022-01-03 LAB — ABO/RH: ABO/RH(D): A POS

## 2022-01-03 LAB — HIV ANTIBODY (ROUTINE TESTING W REFLEX): HIV Screen 4th Generation wRfx: NONREACTIVE

## 2022-01-03 SURGERY — ARTHROPLASTY, KNEE, TOTAL
Anesthesia: Spinal | Site: Knee | Laterality: Left

## 2022-01-03 MED ORDER — SODIUM CHLORIDE 0.9 % IR SOLN
Status: DC | PRN
  Administered 2022-01-03: 1000 mL

## 2022-01-03 MED ORDER — AMITRIPTYLINE HCL 100 MG PO TABS
100.0000 mg | ORAL_TABLET | Freq: Every day | ORAL | Status: DC
  Administered 2022-01-03 – 2022-01-04 (×2): 100 mg via ORAL
  Filled 2022-01-03 (×3): qty 1

## 2022-01-03 MED ORDER — ONDANSETRON HCL 4 MG/2ML IJ SOLN
INTRAMUSCULAR | Status: DC | PRN
  Administered 2022-01-03: 4 mg via INTRAVENOUS

## 2022-01-03 MED ORDER — POVIDONE-IODINE 10 % EX SWAB
2.0000 | Freq: Once | CUTANEOUS | Status: AC
  Administered 2022-01-03: 2 via TOPICAL

## 2022-01-03 MED ORDER — ACETAMINOPHEN 500 MG PO TABS
1000.0000 mg | ORAL_TABLET | Freq: Four times a day (QID) | ORAL | Status: AC
  Administered 2022-01-03 – 2022-01-04 (×4): 1000 mg via ORAL
  Filled 2022-01-03 (×4): qty 2

## 2022-01-03 MED ORDER — ATORVASTATIN CALCIUM 40 MG PO TABS
40.0000 mg | ORAL_TABLET | Freq: Every day | ORAL | Status: DC
  Administered 2022-01-03 – 2022-01-04 (×2): 40 mg via ORAL
  Filled 2022-01-03 (×2): qty 1

## 2022-01-03 MED ORDER — OXYCODONE HCL 5 MG PO TABS
5.0000 mg | ORAL_TABLET | ORAL | Status: DC | PRN
  Administered 2022-01-04: 10 mg via ORAL
  Filled 2022-01-03 (×2): qty 2

## 2022-01-03 MED ORDER — LACTATED RINGERS IV SOLN: INTRAVENOUS | Status: DC

## 2022-01-03 MED ORDER — ROPIVACAINE HCL 5 MG/ML IJ SOLN
INTRAMUSCULAR | Status: DC | PRN
  Administered 2022-01-03: 20 mL via PERINEURAL

## 2022-01-03 MED ORDER — BUPROPION HCL ER (XL) 150 MG PO TB24
150.0000 mg | ORAL_TABLET | Freq: Every day | ORAL | Status: DC
  Administered 2022-01-04 – 2022-01-05 (×2): 150 mg via ORAL
  Filled 2022-01-03 (×2): qty 1

## 2022-01-03 MED ORDER — MIDAZOLAM HCL 2 MG/2ML IJ SOLN
INTRAMUSCULAR | Status: AC
  Filled 2022-01-03: qty 2

## 2022-01-03 MED ORDER — METHOCARBAMOL 500 MG PO TABS
500.0000 mg | ORAL_TABLET | Freq: Four times a day (QID) | ORAL | Status: DC | PRN
  Administered 2022-01-03 – 2022-01-05 (×4): 500 mg via ORAL
  Filled 2022-01-03 (×4): qty 1

## 2022-01-03 MED ORDER — ONDANSETRON HCL 4 MG/2ML IJ SOLN: 4.0000 mg | Freq: Once | INTRAMUSCULAR | Status: DC | PRN

## 2022-01-03 MED ORDER — HYDROMORPHONE HCL 1 MG/ML IJ SOLN
0.5000 mg | INTRAMUSCULAR | Status: DC | PRN
  Administered 2022-01-03 (×2): 1 mg via INTRAVENOUS
  Filled 2022-01-03 (×3): qty 1

## 2022-01-03 MED ORDER — DEXAMETHASONE SODIUM PHOSPHATE 10 MG/ML IJ SOLN
8.0000 mg | Freq: Once | INTRAMUSCULAR | Status: DC
Start: 2022-01-03 — End: 2022-01-03

## 2022-01-03 MED ORDER — BUPIVACAINE LIPOSOME 1.3 % IJ SUSP: 20.0000 mL | Freq: Once | INTRAMUSCULAR | Status: DC

## 2022-01-03 MED ORDER — CEFAZOLIN SODIUM-DEXTROSE 1-4 GM/50ML-% IV SOLN
1.0000 g | Freq: Three times a day (TID) | INTRAVENOUS | Status: AC
  Administered 2022-01-03 – 2022-01-04 (×3): 1 g via INTRAVENOUS
  Filled 2022-01-03 (×3): qty 50

## 2022-01-03 MED ORDER — FENTANYL CITRATE (PF) 250 MCG/5ML IJ SOLN
INTRAMUSCULAR | Status: DC | PRN
  Administered 2022-01-03: 50 ug via INTRAVENOUS

## 2022-01-03 MED ORDER — BUPIVACAINE IN DEXTROSE 0.75-8.25 % IT SOLN
INTRATHECAL | Status: DC | PRN
  Administered 2022-01-03: 1.8 mL via INTRATHECAL

## 2022-01-03 MED ORDER — PROPOFOL 500 MG/50ML IV EMUL
INTRAVENOUS | Status: DC | PRN
  Administered 2022-01-03: 100 ug/kg/min via INTRAVENOUS

## 2022-01-03 MED ORDER — SODIUM CHLORIDE (PF) 0.9 % IJ SOLN
INTRAMUSCULAR | Status: AC
  Filled 2022-01-03: qty 50

## 2022-01-03 MED ORDER — SODIUM CHLORIDE (PF) 0.9 % IJ SOLN
INTRAMUSCULAR | Status: AC
  Filled 2022-01-03: qty 10

## 2022-01-03 MED ORDER — ONDANSETRON HCL 4 MG PO TABS: 4.0000 mg | ORAL_TABLET | Freq: Four times a day (QID) | ORAL | Status: DC | PRN

## 2022-01-03 MED ORDER — TRANEXAMIC ACID-NACL 1000-0.7 MG/100ML-% IV SOLN
1000.0000 mg | INTRAVENOUS | Status: AC
  Administered 2022-01-03: 1000 mg via INTRAVENOUS
  Filled 2022-01-03: qty 100

## 2022-01-03 MED ORDER — METHOCARBAMOL 500 MG IVPB - SIMPLE MED
500.0000 mg | Freq: Four times a day (QID) | INTRAVENOUS | Status: DC | PRN
  Administered 2022-01-03: 500 mg via INTRAVENOUS

## 2022-01-03 MED ORDER — ENOXAPARIN SODIUM 40 MG/0.4ML IJ SOSY
40.0000 mg | PREFILLED_SYRINGE | INTRAMUSCULAR | Status: DC
  Administered 2022-01-04 – 2022-01-05 (×2): 40 mg via SUBCUTANEOUS
  Filled 2022-01-03 (×2): qty 0.4

## 2022-01-03 MED ORDER — STERILE WATER FOR IRRIGATION IR SOLN
Status: DC | PRN
  Administered 2022-01-03: 2000 mL

## 2022-01-03 MED ORDER — PROPOFOL 10 MG/ML IV BOLUS
INTRAVENOUS | Status: AC
  Filled 2022-01-03: qty 20

## 2022-01-03 MED ORDER — CARVEDILOL 25 MG PO TABS
25.0000 mg | ORAL_TABLET | Freq: Two times a day (BID) | ORAL | Status: DC
  Administered 2022-01-03 – 2022-01-05 (×4): 25 mg via ORAL
  Filled 2022-01-03 (×4): qty 1

## 2022-01-03 MED ORDER — ORAL CARE MOUTH RINSE: 15.0000 mL | Freq: Once | OROMUCOSAL | Status: AC

## 2022-01-03 MED ORDER — ALBUTEROL SULFATE (2.5 MG/3ML) 0.083% IN NEBU: 2.5000 mg | INHALATION_SOLUTION | Freq: Four times a day (QID) | RESPIRATORY_TRACT | Status: DC | PRN

## 2022-01-03 MED ORDER — SODIUM CHLORIDE 0.9 % IV SOLN
INTRAVENOUS | Status: DC | PRN
  Administered 2022-01-03: 80 mL

## 2022-01-03 MED ORDER — DEXAMETHASONE SODIUM PHOSPHATE 10 MG/ML IJ SOLN
INTRAMUSCULAR | Status: DC | PRN
  Administered 2022-01-03: 5 mg via INTRAVENOUS

## 2022-01-03 MED ORDER — SPIRONOLACTONE 25 MG PO TABS
25.0000 mg | ORAL_TABLET | Freq: Every day | ORAL | Status: DC
  Administered 2022-01-04 – 2022-01-05 (×2): 25 mg via ORAL
  Filled 2022-01-03 (×2): qty 1

## 2022-01-03 MED ORDER — CITALOPRAM HYDROBROMIDE 10 MG PO TABS
10.0000 mg | ORAL_TABLET | Freq: Every day | ORAL | Status: DC
  Administered 2022-01-04 – 2022-01-05 (×2): 10 mg via ORAL
  Filled 2022-01-03 (×2): qty 1

## 2022-01-03 MED ORDER — 0.9 % SODIUM CHLORIDE (POUR BTL) OPTIME
TOPICAL | Status: DC | PRN
  Administered 2022-01-03: 1000 mL

## 2022-01-03 MED ORDER — ACETAMINOPHEN 500 MG PO TABS
1000.0000 mg | ORAL_TABLET | Freq: Once | ORAL | Status: AC
  Administered 2022-01-03: 1000 mg via ORAL
  Filled 2022-01-03: qty 2

## 2022-01-03 MED ORDER — DIPHENHYDRAMINE HCL 12.5 MG/5ML PO ELIX: 12.5000 mg | ORAL_SOLUTION | ORAL | Status: DC | PRN

## 2022-01-03 MED ORDER — BUPIVACAINE LIPOSOME 1.3 % IJ SUSP
INTRAMUSCULAR | Status: AC
  Filled 2022-01-03: qty 20

## 2022-01-03 MED ORDER — METHOCARBAMOL 500 MG IVPB - SIMPLE MED
INTRAVENOUS | Status: AC
  Filled 2022-01-03: qty 55

## 2022-01-03 MED ORDER — CEFAZOLIN SODIUM-DEXTROSE 2-4 GM/100ML-% IV SOLN
2.0000 g | INTRAVENOUS | Status: AC
  Administered 2022-01-03: 2 g via INTRAVENOUS
  Filled 2022-01-03: qty 100

## 2022-01-03 MED ORDER — FENTANYL CITRATE PF 50 MCG/ML IJ SOSY
25.0000 ug | PREFILLED_SYRINGE | INTRAMUSCULAR | Status: DC | PRN
  Administered 2022-01-03 (×3): 50 ug via INTRAVENOUS

## 2022-01-03 MED ORDER — SODIUM CHLORIDE 0.9 % IV SOLN: INTRAVENOUS | Status: DC

## 2022-01-03 MED ORDER — MIDAZOLAM HCL 2 MG/2ML IJ SOLN
INTRAMUSCULAR | Status: DC | PRN
  Administered 2022-01-03: 2 mg via INTRAVENOUS

## 2022-01-03 MED ORDER — FENTANYL CITRATE (PF) 100 MCG/2ML IJ SOLN
INTRAMUSCULAR | Status: AC
  Filled 2022-01-03: qty 2

## 2022-01-03 MED ORDER — PHENYLEPHRINE 80 MCG/ML (10ML) SYRINGE FOR IV PUSH (FOR BLOOD PRESSURE SUPPORT)
PREFILLED_SYRINGE | INTRAVENOUS | Status: AC
  Filled 2022-01-03: qty 20

## 2022-01-03 MED ORDER — CHLORHEXIDINE GLUCONATE 0.12 % MT SOLN
15.0000 mL | Freq: Once | OROMUCOSAL | Status: AC
  Administered 2022-01-03: 15 mL via OROMUCOSAL

## 2022-01-03 MED ORDER — HYDROMORPHONE HCL 2 MG PO TABS
2.0000 mg | ORAL_TABLET | ORAL | Status: DC | PRN
  Administered 2022-01-03 (×2): 2 mg via ORAL
  Administered 2022-01-04 – 2022-01-05 (×7): 3 mg via ORAL
  Filled 2022-01-03 (×4): qty 2
  Filled 2022-01-03: qty 1
  Filled 2022-01-03: qty 2
  Filled 2022-01-03: qty 1
  Filled 2022-01-03 (×2): qty 2
  Filled 2022-01-03: qty 1

## 2022-01-03 MED ORDER — DAPAGLIFLOZIN PROPANEDIOL 10 MG PO TABS
10.0000 mg | ORAL_TABLET | Freq: Every day | ORAL | Status: DC
  Administered 2022-01-04 – 2022-01-05 (×2): 10 mg via ORAL
  Filled 2022-01-03 (×2): qty 1

## 2022-01-03 MED ORDER — PHENYLEPHRINE 80 MCG/ML (10ML) SYRINGE FOR IV PUSH (FOR BLOOD PRESSURE SUPPORT)
PREFILLED_SYRINGE | INTRAVENOUS | Status: DC | PRN
  Administered 2022-01-03 (×3): 80 ug via INTRAVENOUS

## 2022-01-03 MED ORDER — TRAMADOL HCL 50 MG PO TABS
50.0000 mg | ORAL_TABLET | Freq: Four times a day (QID) | ORAL | Status: DC
  Administered 2022-01-03 – 2022-01-05 (×8): 50 mg via ORAL
  Filled 2022-01-03 (×8): qty 1

## 2022-01-03 MED ORDER — FENTANYL CITRATE PF 50 MCG/ML IJ SOSY
PREFILLED_SYRINGE | INTRAMUSCULAR | Status: AC
  Filled 2022-01-03: qty 3

## 2022-01-03 MED ORDER — BISACODYL 5 MG PO TBEC: 5.0000 mg | DELAYED_RELEASE_TABLET | Freq: Every day | ORAL | Status: DC | PRN

## 2022-01-03 MED ORDER — ONDANSETRON HCL 4 MG/2ML IJ SOLN
4.0000 mg | Freq: Four times a day (QID) | INTRAMUSCULAR | Status: DC | PRN
  Filled 2022-01-03: qty 2

## 2022-01-03 MED ORDER — SENNOSIDES-DOCUSATE SODIUM 8.6-50 MG PO TABS: 1.0000 | ORAL_TABLET | Freq: Every evening | ORAL | Status: DC | PRN

## 2022-01-03 MED ORDER — ACETAMINOPHEN 325 MG PO TABS: 325.0000 mg | ORAL_TABLET | Freq: Four times a day (QID) | ORAL | Status: DC | PRN

## 2022-01-03 SURGICAL SUPPLY — 61 items
ADH SKN CLS APL DERMABOND .7 (GAUZE/BANDAGES/DRESSINGS) ×1
ATTUNE PS FEM LT SZ 5 CEM KNEE (Femur) ×1 IMPLANT
ATTUNE PSRP INSR SZ5 8 KNEE (Insert) ×1 IMPLANT
BAG COUNTER SPONGE SURGICOUNT (BAG) ×2 IMPLANT
BAG SPEC THK2 15X12 ZIP CLS (MISCELLANEOUS)
BAG SPNG CNTER NS LX DISP (BAG) ×1
BAG ZIPLOCK 12X15 (MISCELLANEOUS) ×1 IMPLANT
BASE TIBIAL ROT PLAT SZ 5 KNEE (Knees) IMPLANT
BLADE SAG 18X100X1.27 (BLADE) ×2 IMPLANT
BLADE SAW SGTL 11.0X1.19X90.0M (BLADE) ×2 IMPLANT
BNDG CMPR STD VLCR NS LF 5.8X4 (GAUZE/BANDAGES/DRESSINGS) ×1
BNDG ELASTIC 4X5.8 VLCR NS LF (GAUZE/BANDAGES/DRESSINGS) ×1 IMPLANT
BNDG ELASTIC 4X5.8 VLCR STR LF (GAUZE/BANDAGES/DRESSINGS) ×2 IMPLANT
BNDG ELASTIC 6X5.8 VLCR STR LF (GAUZE/BANDAGES/DRESSINGS) ×2 IMPLANT
BOWL SMART MIX CTS (DISPOSABLE) ×2 IMPLANT
BSPLAT TIB 5 CMNT ROT PLAT STR (Knees) ×1 IMPLANT
CEMENT HV SMART SET (Cement) ×2 IMPLANT
COVER SURGICAL LIGHT HANDLE (MISCELLANEOUS) ×2 IMPLANT
CUFF TOURN SGL QUICK 34 (TOURNIQUET CUFF) ×2
CUFF TRNQT CYL 34X4.125X (TOURNIQUET CUFF) ×1 IMPLANT
DERMABOND ADVANCED (GAUZE/BANDAGES/DRESSINGS) ×1
DERMABOND ADVANCED .7 DNX12 (GAUZE/BANDAGES/DRESSINGS) ×1 IMPLANT
DRAPE INCISE IOBAN 66X45 STRL (DRAPES) ×2 IMPLANT
DRAPE U-SHAPE 47X51 STRL (DRAPES) ×2 IMPLANT
DRSG AQUACEL AG ADV 3.5X10 (GAUZE/BANDAGES/DRESSINGS) ×2 IMPLANT
DURAPREP 26ML APPLICATOR (WOUND CARE) ×4 IMPLANT
ELECT REM PT RETURN 15FT ADLT (MISCELLANEOUS) ×2 IMPLANT
GLOVE BIOGEL PI IND STRL 7.0 (GLOVE) IMPLANT
GLOVE BIOGEL PI IND STRL 7.5 (GLOVE) ×1 IMPLANT
GLOVE BIOGEL PI IND STRL 8 (GLOVE) ×1 IMPLANT
GLOVE BIOGEL PI INDICATOR 7.0 (GLOVE) ×4
GLOVE BIOGEL PI INDICATOR 7.5 (GLOVE) ×1
GLOVE BIOGEL PI INDICATOR 8 (GLOVE) ×1
GLOVE ECLIPSE 8.0 STRL XLNG CF (GLOVE) ×2 IMPLANT
GLOVE SURG ORTHO 9.0 STRL STRW (GLOVE) ×2 IMPLANT
GLOVE SURG SS PI 7.0 STRL IVOR (GLOVE) ×2 IMPLANT
GOWN SPEC L4 XLG W/TWL (GOWN DISPOSABLE) ×4 IMPLANT
HANDPIECE INTERPULSE COAX TIP (DISPOSABLE) ×2
KIT TURNOVER KIT A (KITS) IMPLANT
NS IRRIG 1000ML POUR BTL (IV SOLUTION) ×2 IMPLANT
PACK TOTAL KNEE CUSTOM (KITS) ×2 IMPLANT
PATELLA MEDIAL ATTUN 35MM KNEE (Knees) ×1 IMPLANT
PROTECTOR NERVE ULNAR (MISCELLANEOUS) ×2 IMPLANT
SET HNDPC FAN SPRY TIP SCT (DISPOSABLE) ×1 IMPLANT
SET PAD KNEE POSITIONER (MISCELLANEOUS) ×2 IMPLANT
SOLUTION IRRIG SURGIPHOR (IV SOLUTION) ×1 IMPLANT
SPIKE FLUID TRANSFER (MISCELLANEOUS) ×3 IMPLANT
SPONGE SURGIFOAM ABS GEL 100 (HEMOSTASIS) ×2 IMPLANT
STOCKINETTE 6  STRL (DRAPES) ×2
STOCKINETTE 6 STRL (DRAPES) ×1 IMPLANT
SUT MNCRL AB 3-0 PS2 18 (SUTURE) ×2 IMPLANT
SUT VIC AB 1 CT1 27 (SUTURE) ×6
SUT VIC AB 1 CT1 27XBRD ANTBC (SUTURE) ×3 IMPLANT
SUT VIC AB 2-0 CT1 27 (SUTURE) ×4
SUT VIC AB 2-0 CT1 TAPERPNT 27 (SUTURE) ×2 IMPLANT
SUT VLOC 180 0 24IN GS25 (SUTURE) ×2 IMPLANT
SYR 50ML LL SCALE MARK (SYRINGE) ×1 IMPLANT
TAPE STRIPS DRAPE STRL (GAUZE/BANDAGES/DRESSINGS) ×2 IMPLANT
TIBIAL BASE ROT PLAT SZ 5 KNEE (Knees) ×2 IMPLANT
TRAY CATH INTERMITTENT SS 16FR (CATHETERS) ×1 IMPLANT
WATER STERILE IRR 1000ML POUR (IV SOLUTION) ×4 IMPLANT

## 2022-01-03 NOTE — Anesthesia Procedure Notes (Signed)
Anesthesia Regional Block: Adductor canal block   Pre-Anesthetic Checklist: , timeout performed,  Correct Patient, Correct Site, Correct Laterality,  Correct Procedure, Correct Position, site marked,  Risks and benefits discussed,  Surgical consent,  Pre-op evaluation,  At surgeon's request and post-op pain management  Laterality: Left  Prep: chloraprep       Needles:  Injection technique: Single-shot  Needle Type: Echogenic Needle     Needle Length: 9cm  Needle Gauge: 21     Additional Needles:   Procedures:,,,, ultrasound used (permanent image in chart),,    Narrative:  Start time: 01/03/2022 7:58 AM End time: 01/03/2022 8:05 AM Injection made incrementally with aspirations every 5 mL.  Performed by: Personally  Anesthesiologist: Collene Schlichter, MD  Additional Notes: No pain on injection. No increased resistance to injection. Injection made in 5cc increments.  Good needle visualization.  Patient tolerated procedure well.

## 2022-01-03 NOTE — Op Note (Signed)
DATE OF SURGERY:  01/03/2022  TIME: 10:17 AM  PATIENT NAME:  Krista Jenkins    AGE: 61 y.o.   PRE-OPERATIVE DIAGNOSIS:  Left knee osteoarthritis  POST-OPERATIVE DIAGNOSIS:  Left knee osteoarthritis  PROCEDURE:  Procedure(s): TOTAL KNEE ARTHROPLASTY  SURGEON:  Jaiona Simien ANDREW  ASSISTANT:  Leeanne Haus, PA-C, present and scrubbed throughout the case, critical for assistance with exposure, retraction, instrumentation, and closure.  OPERATIVE IMPLANTS: Depuy PFC Attune Rotating Platform.  Femur size 5, Tibia size 5, Patella size 35 3-peg oval button, with a 8 mm polyethylene insert.   PREOPERATIVE INDICATIONS:   Krista Jenkins is a 61 y.o. year old female with end stage bone on bone arthritis of the knee who failed conservative treatment and elected for Total Knee Arthroplasty.   The risks, benefits, and alternatives were discussed at length including but not limited to the risks of infection, bleeding, nerve injury, stiffness, blood clots, the need for revision surgery, cardiopulmonary complications, among others, and they were willing to proceed.  OPERATIVE DESCRIPTION:  The patient was brought to the operative room and placed in a supine position.  Spinal anesthesia was administered.  IV antibiotics were given.  The lower extremity was prepped and draped in the usual sterile fashion.  Time out was performed.  The leg was elevated and exsanguinated and the tourniquet was inflated.  Anterior quadriceps tendon splitting approach was performed.  The patella was retracted and osteophytes were removed.  The anterior horn of the medial and lateral meniscus was removed and cruciate ligaments resected.   The distal femur was opened with the drill and the intramedullary distal femoral cutting jig was utilized, set at 5 degrees resecting 10 mm off the distal femur.  Care was taken to protect the collateral ligaments.  The distal femoral sizing jig was applied, taking care to avoid notching.   Then the 4-in-1 cutting jig was applied and the anterior and posterior femur was cut, along with the chamfer cuts.    Then the extramedullary tibial cutting jig was utilized making the appropriate cut using the anterior tibial crest as a reference building in appropriate posterior slope.  Care was taken during the cut to protect the medial and collateral ligaments.  The proximal tibia was removed along with the posterior horns of the menisci.   The posterior medial femoral osteophytes and posterior lateral femoral osteophytes were removed.    The flexion gap was then measured and was symmetric with the extension gap, measured at 8.  I completed the distal femoral preparation using the appropriate jig to prepare the box.  The patella was then measured, and cut with the saw.    The proximal tibia sized and prepared accordingly with the reamer and the punch, and then all components were trialed with the trial insert.  The knee was found to have excellent balance and full motion.    The above named components were then cemented into place and all excess cement was removed.  The trial polyethylene component was in place during cementation, and then was exchanged for the real polyethylene component.    The knee was easily taken through a range of motion and the patella tracked well and the knee irrigated copiously and the parapatellar and subcutaneous tissue closed with vicryl, and monocryl with steri strips for the skin.  The arthrotomy was closed at 90 of flexion. The wounds were dressed with sterile gauze and the tourniquet released and the patient was awakened and returned to the PACU in stable and  satisfactory condition.  There were no complications.  Total tourniquet time was 80 minutes.

## 2022-01-03 NOTE — Transfer of Care (Signed)
Immediate Anesthesia Transfer of Care Note  Patient: Krista Jenkins  Procedure(s) Performed: TOTAL KNEE ARTHROPLASTY (Left: Knee)  Patient Location: PACU  Anesthesia Type:MAC and Spinal  Level of Consciousness: awake, alert  and oriented  Airway & Oxygen Therapy: Patient Spontanous Breathing  Post-op Assessment: Report given to RN and Post -op Vital signs reviewed and stable  Post vital signs: Reviewed and stable  Last Vitals:  Vitals Value Taken Time  BP 135/85 01/03/22 1048  Temp 36.3 C 01/03/22 1048  Pulse 76 01/03/22 1049  Resp 16 01/03/22 1049  SpO2 94 % 01/03/22 1049  Vitals shown include unvalidated device data.  Last Pain:  Vitals:   01/03/22 0629  TempSrc:   PainSc: 0-No pain      Patients Stated Pain Goal: 3 (29/57/47 3403)  Complications: No notable events documented.

## 2022-01-03 NOTE — Interval H&P Note (Signed)
History and Physical Interval Note:  01/03/2022 8:19 AM  Krista Jenkins  has presented today for surgery, with the diagnosis of Left knee osteoarthritis.  The various methods of treatment have been discussed with the patient and family. After consideration of risks, benefits and other options for treatment, the patient has consented to  Procedure(s) with comments: TOTAL KNEE ARTHROPLASTY (Left) - adductor canal 120 as a surgical intervention.  The patient's history has been reviewed, patient examined, no change in status, stable for surgery.  I have reviewed the patient's chart and labs.  Questions were answered to the patient's satisfaction.     [name] ANDREW

## 2022-01-03 NOTE — Plan of Care (Signed)
  Problem: Nutrition: Goal: Adequate nutrition will be maintained Outcome: Progressing   

## 2022-01-03 NOTE — Plan of Care (Signed)
  Problem: Activity: Goal: Risk for activity intolerance will decrease Outcome: Progressing   Problem: Pain Managment: Goal: General experience of comfort will improve Outcome: Progressing   Problem: Safety: Goal: Ability to remain free from injury will improve Outcome: Progressing   

## 2022-01-03 NOTE — Evaluation (Signed)
Physical Therapy Evaluation Patient Details Name: Krista Jenkins MRN: [id number] DOB: 1960/06/26 Today's Date: 01/03/2022  History of Present Illness  Pt is a 61yo female presenting s/p L-TKA on 01/03/22 PMH: CHF, HLD, neck and back pain,  Clinical Impression  Krista Jenkins is a 61 y.o. female POD 0 s/p L-TKA. Patient reports independence with mobility at baseline. Patient is now limited by functional impairments (see PT problem list below) and requires supervision for bed mobility and min assist for transfers, further mobility deferred secondary to pain and nausea. Patient instructed in exercise to facilitate ROM and circulation to manage edema. Patient will benefit from continued skilled PT interventions to address impairments and progress towards PLOF. Acute PT will follow to progress mobility and stair training in preparation for safe discharge home.       Recommendations for follow up therapy are one component of a multi-disciplinary discharge planning process, led by the attending physician.  Recommendations may be updated based on patient status, additional functional criteria and insurance authorization.  Follow Up Recommendations Follow physician's recommendations for discharge plan and follow up therapies      Assistance Recommended at Discharge Intermittent Supervision/Assistance  Patient can return home with the following  A little help with walking and/or transfers;A little help with bathing/dressing/bathroom;Assistance with cooking/housework;Assist for transportation;Help with stairs or ramp for entrance    Equipment Recommendations None recommended by PT (Pt has recommended DME)  Recommendations for Other Services       Functional Status Assessment Patient has had a recent decline in their functional status and demonstrates the ability to make significant improvements in function in a reasonable and predictable amount of time.     Precautions / Restrictions  Precautions Precautions: Fall Restrictions Weight Bearing Restrictions: No Other Position/Activity Restrictions: wbat      Mobility  Bed Mobility Overal bed mobility: Needs Assistance Bed Mobility: Supine to Sit, Sit to Supine     Supine to sit: Supervision Sit to supine: Supervision   General bed mobility comments: for safety only, no physical assist required.    Transfers Overall transfer level: Needs assistance Equipment used: Rolling walker (2 wheels) Transfers: Sit to/from Stand Sit to Stand: Min guard, From elevated surface           General transfer comment: for safety only from elevated surface, no physical assist required. Step Pivot x2: Pt required min assist for steadying of RW with VCs for sequencing. Pt demonstrated fear of WB on LLE and "hovered" foot during first step pivot transfer to [facility]. From [facility] to bed, pt able to place foot on floor but demonstrated R foot "shimmeying" without lifting R foot. Pt able to take several side-steps to adjust positioning in bed prior to performing stand to sit transfer. Further mobility deferred secondary to pain and nausea.    Ambulation/Gait               General Gait Details: deferred  Stairs            Wheelchair Mobility    Modified Rankin (Stroke Patients Only)       Balance Overall balance assessment: Needs assistance Sitting-balance support: No upper extremity supported, Feet supported Sitting balance-Leahy Scale: Good     Standing balance support: During functional activity, Reliant on assistive device for balance, Bilateral upper extremity supported Standing balance-Leahy Scale: Poor  Pertinent Vitals/Pain Pain Assessment Pain Assessment: Faces Faces Pain Scale: Hurts whole lot Pain Location: L knee Pain Descriptors / Indicators: Operative site guarding Pain Intervention(s): Limited activity within patient's tolerance, Monitored during session,  Repositioned, Ice applied, RN gave pain meds during session    Home Living Family/patient expects to be discharged to:: Private residence Living Arrangements: Spouse/significant other;Children Available Help at Discharge: Family;Available 24 hours/day Type of Home: House Home Access: Stairs to enter Entrance Stairs-Rails: Right Entrance Stairs-Number of Steps: 3   Home Layout: One level Home Equipment: Agricultural consultant (2 wheels);BSC/3in1;Cane - single point      Prior Function Prior Level of Function : Independent/Modified Independent             Mobility Comments: ind ADLs Comments: ind     Hand Dominance        Extremity/Trunk Assessment   Upper Extremity Assessment Upper Extremity Assessment: Overall WFL for tasks assessed    Lower Extremity Assessment Lower Extremity Assessment: RLE deficits/detail;LLE deficits/detail RLE Deficits / Details: MMT ank DF/PF 4/5 RLE Sensation: WNL LLE Deficits / Details: MMT ank DF/PF 4/5, no extensor lag noted LLE Sensation: WNL    Cervical / Trunk Assessment Cervical / Trunk Assessment: Normal  Communication   Communication: No difficulties  Cognition Arousal/Alertness: Awake/alert Behavior During Therapy: Flat affect, WFL for tasks assessed/performed Overall Cognitive Status: Within Functional Limits for tasks assessed                                          General Comments General comments (skin integrity, edema, etc.): Husband Theodoro Grist present for session    Exercises Total Joint Exercises Ankle Circles/Pumps: AROM, Both, 10 reps, Supine   Assessment/Plan    PT Assessment Patient needs continued PT services  PT Problem List Decreased strength;Decreased range of motion;Decreased activity tolerance;Decreased balance;Decreased mobility;Decreased coordination;Pain       PT Treatment Interventions DME instruction;Gait training;Stair training;Functional mobility training;Therapeutic  activities;Therapeutic exercise;Balance training;Neuromuscular re-education;Patient/family education    PT Goals (Current goals can be found in the Care Plan section)  Acute Rehab PT Goals Patient Stated Goal: To reduce pain PT Goal Formulation: With patient Time For Goal Achievement: 01/10/22 Potential to Achieve Goals: Fair    Frequency 7X/week     Co-evaluation               AM-PAC PT "6 Clicks" Mobility  Outcome Measure Help needed turning from your back to your side while in a flat bed without using bedrails?: None Help needed moving from lying on your back to sitting on the side of a flat bed without using bedrails?: None Help needed moving to and from a bed to a chair (including a wheelchair)?: A Little Help needed standing up from a chair using your arms (e.g., wheelchair or bedside chair)?: A Little Help needed to walk in hospital room?: A Little Help needed climbing 3-5 steps with a railing? : A Little 6 Click Score: 20    End of Session Equipment Utilized During Treatment: Gait belt Activity Tolerance: Patient limited by pain Patient left: in bed;with call bell/phone within reach;with bed alarm set;with family/visitor present;with SCD's reapplied Nurse Communication: Mobility status PT Visit Diagnosis: Pain;Difficulty in walking, not elsewhere classified (R26.2) Pain - Right/Left: Left Pain - part of body: Knee    Time: [id number] PT Time Calculation (min) (ACUTE ONLY): 18 min   Charges:   PT  Evaluation $PT Eval Low Complexity: 1 Low          Jamesetta Geralds, Velva, DPT [facility] Rehabilitation Department Office: [phone] Pager: [phone]  Jamesetta Geralds 01/03/2022, 6:10 PM

## 2022-01-04 DIAGNOSIS — M1712 Unilateral primary osteoarthritis, left knee: Secondary | ICD-10-CM | POA: Diagnosis not present

## 2022-01-04 LAB — CBC
HCT: 37.3 % (ref 36.0–46.0)
Hemoglobin: 12.2 g/dL (ref 12.0–15.0)
MCH: 30.7 pg (ref 26.0–34.0)
MCHC: 32.7 g/dL (ref 30.0–36.0)
MCV: 94 fL (ref 80.0–100.0)
Platelets: 201 10*3/uL (ref 150–400)
RBC: 3.97 MIL/uL (ref 3.87–5.11)
RDW: 13.2 % (ref 11.5–15.5)
WBC: 11 10*3/uL — ABNORMAL HIGH (ref 4.0–10.5)
nRBC: 0 % (ref 0.0–0.2)

## 2022-01-04 MED ORDER — GABAPENTIN 300 MG PO CAPS
300.0000 mg | ORAL_CAPSULE | Freq: Two times a day (BID) | ORAL | Status: DC
  Administered 2022-01-04 – 2022-01-05 (×3): 300 mg via ORAL
  Filled 2022-01-04 (×3): qty 1

## 2022-01-04 NOTE — Anesthesia Postprocedure Evaluation (Signed)
Anesthesia Post Note  Patient: Vianna Venezia  Procedure(s) Performed: TOTAL KNEE ARTHROPLASTY (Left: Knee)     Patient location during evaluation: PACU Anesthesia Type: Spinal Level of consciousness: awake, awake and alert and oriented Pain management: pain level controlled Vital Signs Assessment: post-procedure vital signs reviewed and stable Respiratory status: spontaneous breathing, nonlabored ventilation and respiratory function stable Cardiovascular status: blood pressure returned to baseline and stable Postop Assessment: no headache, no backache, spinal receding and no apparent nausea or vomiting Anesthetic complications: no   No notable events documented.  Last Vitals:  Vitals:   01/04/22 0515 01/04/22 0817  BP: 139/89 (!) 145/87  Pulse: 95 92  Resp: 16 17  Temp: 36.4 C 36.8 C  SpO2: 100% 95%    Last Pain:  Vitals:   01/04/22 0817  TempSrc: Oral  PainSc:                  Collene Schlichter

## 2022-01-04 NOTE — TOC Transition Note (Signed)
Transition of Care [facility]) - CM/SW Discharge Note  Patient Details  Name: Krista Jenkins MRN: [id number] Date of Birth: 1960-09-11  Transition of Care [facility]) CM/SW Contact:  Sherie Don, LCSW Phone Number: 01/04/2022, 12:40 PM  Clinical Narrative: Patient is expected to discharge home after working with PT. [place] met with patient to confirm discharge plan and needs. Patient will go home with OPPT per H&P, but patient reported she will have HHPT for 5 visits before starting OPPT. Patient will need a rolling walker. CSW made DME referral to [place] with [employer]. [employer] to deliver rolling walker to patient's room. TOC signing off.  Final next level of care: OP Rehab Barriers to Discharge: No Barriers Identified  Patient Goals and CMS Choice Patient states their goals for this hospitalization and ongoing recovery are:: Discharge home with [place] CMS Medicare.gov Compare Post Acute Care list provided to:: Patient Choice offered to / list presented to : Patient  Discharge Plan and Services         DME Arranged: Walker rolling DME Agency: [employer] Date DME Agency Contacted: 01/04/22 Time DME Agency Contacted: [id number] Representative spoke with at DME Agency: [place]  Readmission Risk Interventions     No data to display

## 2022-01-04 NOTE — Progress Notes (Signed)
Subjective: 1 Day Post-Op Procedure(s) (LRB): TOTAL KNEE ARTHROPLASTY (Left) Patient reports pain as 8 on 0-10 scale.   Patient reports pain in the knee at rest She was able to get up with physical therapy yesterday due to pain and nausea Patient reports she was unable to eat anything yesterday besides a third of a banana. No events overnight, denies any chest pain, shortness of breath  Objective: Vital signs in last 24 hours: Temp:  [97.3 F (36.3 C)-97.6 F (36.4 C)] 97.6 F (36.4 C) (07/13 0515) Pulse Rate:  [65-96] 95 (07/13 0515) Resp:  [12-21] 16 (07/13 0515) BP: (106-139)/(70-91) 139/89 (07/13 0515) SpO2:  [95 %-100 %] 100 % (07/13 0515)  Intake/Output from previous day: 07/12 0701 - 07/13 0700 In: 1898.3 [P.O.:240; I.V.:1558.3; IV Piggyback:100] Out: 50 [Blood:50] Intake/Output this shift: Total I/O In: 658.3 [I.V.:558.3; IV Piggyback:100] Out: -   Recent Labs    01/03/22 1130 01/04/22 0353  HGB 13.2 12.2   Recent Labs    01/03/22 1130 01/04/22 0353  WBC 5.1 11.0*  RBC 4.40 3.97  HCT 41.4 37.3  PLT 196 201   Recent Labs    01/03/22 1130  CREATININE 0.83   No results for input(s): "LABPT", "INR" in the last 72 hours.  Neurologically intact Neurovascular intact Sensation intact distally Intact pulses distally Dorsiflexion/Plantar flexion intact Incision: dressing C/D/I No cellulitis present Compartment soft   Assessment/Plan: 1 Day Post-Op Procedure(s) (LRB): TOTAL KNEE ARTHROPLASTY (Left) Advance diet Up with therapy weightbearing as tolerated on left lower extremity At this point we need to get her up with therapy and ambulating.  She does have 3 steps that she needs to be able to maneuver to get into her house prior to discharge. We will work on continued pain control at this time. I do not believe that she will be able to discharge home today, I think we need 1 more day for physical therapy as well as pain control I will add in gabapentin  for nighttime.    Patient's anticipated LOS is less than 2 midnights, meeting these requirements: - Younger than 59 - Lives within 1 hour of care - Has a competent adult at home to recover with post-op recover - NO history of  - Chronic pain requiring opiods  - Diabetes  - Coronary Artery Disease  - Heart failure  - Heart attack  - Stroke  - DVT/VTE  - Cardiac arrhythmia  - Respiratory Failure/COPD  - Renal failure  - Anemia  - Advanced Liver disease     Krista Dark, PA-C With Dr. Shawnie Pons 01/04/2022, 6:33 AM

## 2022-01-04 NOTE — Plan of Care (Signed)

## 2022-01-04 NOTE — Progress Notes (Signed)
Physical Therapy Treatment Patient Details Name: Krista Jenkins MRN: [id number] DOB: 25-Nov-1960 Today's Date: 01/04/2022   History of Present Illness Pt is a 61yo female presenting s/p L-TKA on 01/03/22 PMH: CHF, HLD, neck and back pain,    PT Comments    Pt seen this am after receiving pain meds. Flat affect requiring repeated verbal cues to complete tasks. Overall pt is able to complete bed mobility and transfers with supervision, however once standing, pt only able to tolerate 75ft with RW and Min Guard due to dizziness.  Will need to see pt this pm to reassess tolerance for standing/gait and attempt stairs prior to clearing for d/c home. O2 sats remained in mid upper 90's, HR 94bpm.   Recommendations for follow up therapy are one component of a multi-disciplinary discharge planning process, led by the attending physician.  Recommendations may be updated based on patient status, additional functional criteria and insurance authorization.  Follow Up Recommendations  Follow physician's recommendations for discharge plan and follow up therapies     Assistance Recommended at Discharge Intermittent Supervision/Assistance  Patient can return home with the following A little help with walking and/or transfers;A little help with bathing/dressing/bathroom;Assistance with cooking/housework;Assist for transportation;Help with stairs or ramp for entrance   Equipment Recommendations  None recommended by PT    Recommendations for Other Services       Precautions / Restrictions Precautions Precautions: Fall Restrictions Weight Bearing Restrictions: No Other Position/Activity Restrictions: wbat     Mobility  Bed Mobility Overal bed mobility: Needs Assistance Bed Mobility: Supine to Sit, Sit to Supine     Supine to sit: Supervision Sit to supine: Supervision   General bed mobility comments: for safety only, no physical assist required.    Transfers Overall transfer level: Needs  assistance Equipment used: Rolling walker (2 wheels) Transfers: Sit to/from Stand Sit to Stand: Supervision, From elevated surface                Ambulation/Gait Ambulation/Gait assistance: Min guard Gait Distance (Feet): 50 Feet Assistive device: Rolling walker (2 wheels) Gait Pattern/deviations: Step-to pattern, Decreased weight shift to left Gait velocity: decreased     General Gait Details: Pt became slightly dizzy once up and walking, only tolerating short distance   Stairs Stairs: Yes     Number of Stairs: 3 General stair comments:  (Will attempt in pm session)   Wheelchair Mobility    Modified Rankin (Stroke Patients Only)       Balance Overall balance assessment: Needs assistance Sitting-balance support: No upper extremity supported, Feet supported Sitting balance-Leahy Scale: Good     Standing balance support: During functional activity, Reliant on assistive device for balance, Bilateral upper extremity supported Standing balance-Leahy Scale: Fair Standing balance comment:  (c/o dizziness in standing)                            Cognition Arousal/Alertness: Awake/alert Behavior During Therapy: WFL for tasks assessed/performed Overall Cognitive Status: Within Functional Limits for tasks assessed                                          Exercises Total Joint Exercises Ankle Circles/Pumps: AROM, Both, 10 reps, Supine Quad Sets: AROM, Left, 10 reps Heel Slides: AROM, Left, 5 reps, Supine Knee Flexion: AROM, Seated, Left Goniometric ROM: 3-90    General Comments  Pertinent Vitals/Pain Pain Assessment Pain Assessment: 0-10 Pain Score: 4  Pain Location: L knee Pain Descriptors / Indicators: Aching, Discomfort Pain Intervention(s): Monitored during session, Premedicated before session, Ice applied    Home Living                          Prior Function            PT Goals (current goals can  now be found in the care plan section) Acute Rehab PT Goals Patient Stated Goal: To reduce pain Progress towards PT goals: Progressing toward goals    Frequency    7X/week      PT Plan Current plan remains appropriate    Co-evaluation              AM-PAC PT "6 Clicks" Mobility   Outcome Measure  Help needed turning from your back to your side while in a flat bed without using bedrails?: None Help needed moving from lying on your back to sitting on the side of a flat bed without using bedrails?: None Help needed moving to and from a bed to a chair (including a wheelchair)?: A Little Help needed standing up from a chair using your arms (e.g., wheelchair or bedside chair)?: A Little Help needed to walk in hospital room?: A Little Help needed climbing 3-5 steps with a railing? : A Little 6 Click Score: 20    End of Session Equipment Utilized During Treatment: Gait belt Activity Tolerance: Patient tolerated treatment well (needed repeated vc's due to effects of pain meds) Patient left: in chair;with call bell/phone within reach;with family/visitor present Nurse Communication: Mobility status PT Visit Diagnosis: Pain;Difficulty in walking, not elsewhere classified (R26.2) Pain - Right/Left: Left Pain - part of body: Knee     Time: 0950-1020 PT Time Calculation (min) (ACUTE ONLY): 30 min  Charges:  $Gait Training: 8-22 mins $Therapeutic Exercise: 8-22 mins                    Zadie Cleverly, PTA    Jannet Askew 01/04/2022, 10:58 AM

## 2022-01-04 NOTE — Progress Notes (Signed)
Physical Therapy Treatment Patient Details Name: Krista Jenkins MRN: [id number] DOB: Jan 06, 1961 Today's Date: 01/04/2022   History of Present Illness Pt is a 61yo female presenting s/p L-TKA on 01/03/22 PMH: CHF, HLD, neck and back pain,    PT Comments    Pt able to tolerate further distance with gait training. No c/o dizziness this session. Will plan to assess on stairs tomorrow am prior to d/c home. Pt states she will have HHPT initially prior to transitioning to Out-pt.  RW was delivered to pt's home today.   Recommendations for follow up therapy are one component of a multi-disciplinary discharge planning process, led by the attending physician.  Recommendations may be updated based on patient status, additional functional criteria and insurance authorization.  Follow Up Recommendations  Follow physician's recommendations for discharge plan and follow up therapies     Assistance Recommended at Discharge Intermittent Supervision/Assistance  Patient can return home with the following A little help with walking and/or transfers;A little help with bathing/dressing/bathroom;Assistance with cooking/housework;Assist for transportation;Help with stairs or ramp for entrance   Equipment Recommendations  None recommended by PT    Recommendations for Other Services       Precautions / Restrictions Precautions Precautions: Fall Restrictions Weight Bearing Restrictions: No LLE Weight Bearing: Weight bearing as tolerated     Mobility  Bed Mobility Overal bed mobility: Needs Assistance Bed Mobility: Supine to Sit, Sit to Supine     Supine to sit: Modified independent (Device/Increase time) Sit to supine: Modified independent (Device/Increase time)   General bed mobility comments: for safety only, no physical assist required.    Transfers Overall transfer level: Needs assistance Equipment used: Rolling walker (2 wheels) Transfers: Sit to/from Stand Sit to Stand: Supervision                 Ambulation/Gait Ambulation/Gait assistance: Min guard Gait Distance (Feet):  (125) Assistive device: Rolling walker (2 wheels) Gait Pattern/deviations: Step-to pattern, Decreased weight shift to left Gait velocity: decreased     General Gait Details: No c/o dizziness   Stairs Stairs: Yes     Number of Stairs: 3 General stair comments:  (Will attempt stairs in am)   Wheelchair Mobility    Modified Rankin (Stroke Patients Only)       Balance Overall balance assessment: Needs assistance Sitting-balance support: No upper extremity supported, Feet supported Sitting balance-Leahy Scale: Good     Standing balance support: During functional activity, Reliant on assistive device for balance, Bilateral upper extremity supported Standing balance-Leahy Scale: Fair Standing balance comment:  (c/o dizziness in standing)                            Cognition Arousal/Alertness: Awake/alert Behavior During Therapy: WFL for tasks assessed/performed Overall Cognitive Status: Within Functional Limits for tasks assessed                                          Exercises Total Joint Exercises Ankle Circles/Pumps: AROM, Both, 10 reps, Supine Quad Sets: AROM, Left, 10 reps Heel Slides: AROM, Left, 5 reps, Supine Knee Flexion: AROM, Seated, Left Goniometric ROM: 3-90    General Comments        Pertinent Vitals/Pain Pain Assessment Pain Assessment: 0-10 Pain Score: 6  Pain Location: L knee Pain Descriptors / Indicators: Aching, Discomfort Pain Intervention(s): Monitored during session, Ice applied  Home Living                          Prior Function            PT Goals (current goals can now be found in the care plan section) Acute Rehab PT Goals Patient Stated Goal: To reduce pain Progress towards PT goals: Progressing toward goals    Frequency    7X/week      PT Plan Current plan remains appropriate     Co-evaluation              AM-PAC PT "6 Clicks" Mobility   Outcome Measure  Help needed turning from your back to your side while in a flat bed without using bedrails?: None Help needed moving from lying on your back to sitting on the side of a flat bed without using bedrails?: None Help needed moving to and from a bed to a chair (including a wheelchair)?: A Little Help needed standing up from a chair using your arms (e.g., wheelchair or bedside chair)?: A Little Help needed to walk in hospital room?: A Little Help needed climbing 3-5 steps with a railing? : A Little 6 Click Score: 20    End of Session Equipment Utilized During Treatment: Gait belt Activity Tolerance: Patient tolerated treatment well Patient left: in bed;with call bell/phone within reach;with family/visitor present Nurse Communication: Mobility status PT Visit Diagnosis: Pain;Difficulty in walking, not elsewhere classified (R26.2) Pain - Right/Left: Left Pain - part of body: Knee     Time: 0867-6195 PT Time Calculation (min) (ACUTE ONLY): 17 min  Charges:  $Gait Training: 8-22 mins                    Zadie Cleverly, PTA   Jannet Askew 01/04/2022, 4:23 PM

## 2022-01-05 DIAGNOSIS — M1712 Unilateral primary osteoarthritis, left knee: Secondary | ICD-10-CM | POA: Diagnosis not present

## 2022-01-05 MED ORDER — HYDROMORPHONE HCL 2 MG PO TABS: 2.0000 mg | ORAL_TABLET | ORAL | 0 refills | Status: DC | PRN

## 2022-01-05 MED ORDER — ONDANSETRON HCL 4 MG PO TABS: 4.0000 mg | ORAL_TABLET | Freq: Four times a day (QID) | ORAL | 0 refills | Status: DC | PRN

## 2022-01-05 MED ORDER — GABAPENTIN 300 MG PO CAPS: 300.0000 mg | ORAL_CAPSULE | Freq: Two times a day (BID) | ORAL | 0 refills | Status: DC

## 2022-01-05 MED ORDER — ASPIRIN 81 MG PO TBEC: 81.0000 mg | DELAYED_RELEASE_TABLET | Freq: Two times a day (BID) | ORAL | 0 refills | Status: AC

## 2022-01-05 MED ORDER — TRAMADOL HCL 50 MG PO TABS
50.0000 mg | ORAL_TABLET | Freq: Four times a day (QID) | ORAL | 0 refills | Status: DC | PRN
Start: 2022-01-05 — End: 2022-03-29

## 2022-01-05 MED ORDER — METHOCARBAMOL 500 MG PO TABS: 500.0000 mg | ORAL_TABLET | Freq: Four times a day (QID) | ORAL | 0 refills | Status: DC | PRN

## 2022-01-05 NOTE — Discharge Summary (Signed)
Physician Discharge Summary  Patient ID: Krista Jenkins MRN: [id number] DOB/AGE: [birth year] 61 y.o.  Admit date: 01/03/2022 Discharge date: 01/05/2022  Admission Diagnoses: Left knee Osteoarthritis Discharge Diagnoses:  Principal Problem:   Osteoarthritis of left knee   Discharged Condition: good  Hospital Course: Patient was admitted on July 12 for a left total knee arthroplasty due to end-stage left knee osteoarthritis.  Patient tolerated surgery very well.  She was sent to PACU in postop floor in stable condition.  Patient did have some difficulty with pain control first night.  Postop day 1 doing well she was able to finally get up with physical therapy she was able to ambulate the hallway, she does have steps in her house so she was unable to go home.  Pain control was better managed.  Postop day 2 patient doing much better.  She was able to get up with physical therapy she was able to navigate stairs.  Patient was discharged today.  She was sent home on pain medication, muscle relaxer, nausea medication, aspirin 81 mg for DVT prevention.  She will follow-up in the office in 2 weeks.  Consults: None  Significant Diagnostic Studies: none  Treatments: IV hydration, antibiotics: Ancef, analgesia: acetaminophen, Dilaudid, and Tramadol, anticoagulation: Lovenox, therapies: PT, and surgery: Left TKA  Discharge Exam: Blood pressure 110/72, pulse 100, temperature 98.8 F (37.1 C), temperature source Oral, resp. rate 17, height 5\' 5"  (1.651 m), weight 83 kg, SpO2 90 %. General appearance: alert, cooperative, appears stated age, and no distress Extremities: extremities normal, atraumatic, no cyanosis or edema and Homans sign is negative, no sign of DVT Pulses: 2+ and symmetric Skin: Skin color, texture, turgor normal. No rashes or lesions Incision/Wound: Dressings clean dry and intact  Disposition: Discharge disposition: 01-Home or Self Care       Discharge Instructions     Call MD  / Call 911   Complete by: As directed    If you experience chest pain or shortness of breath, CALL 911 and be transported to the hospital emergency room.  If you develope a fever above 101 F, pus (white drainage) or increased drainage or redness at the wound, or calf pain, call your surgeon's office.   Constipation Prevention   Complete by: As directed    Drink plenty of fluids.  Prune juice may be helpful.  You may use a stool softener, such as Colace (over the counter) 100 mg twice a day.  Use MiraLax (over the counter) for constipation as needed.   Diet - low sodium heart healthy   Complete by: As directed    Discharge instructions   Complete by: As directed    Dr. [facility] [address], [address] [place], [place] [phone]  TOTAL KNEE REPLACEMENT POSTOPERATIVE DIRECTIONS  Knee Rehabilitation, Guidelines Following Surgery  Results after knee surgery are often greatly improved when you follow the exercise, range of motion and muscle strengthening exercises prescribed by your doctor. Safety measures are also important to protect the knee from further injury. Any time any of these exercises cause you to have increased pain or swelling in your knee joint, decrease the amount until you are comfortable again and slowly increase them. If you have problems or questions, call your caregiver or physical therapist for advice.   HOME CARE INSTRUCTIONS  Remove items at home which could result in a fall. This includes throw rugs or furniture in walking pathways.  ICE to the affected knee every three hours for  30 minutes at a time and then as needed for pain and swelling.  Continue to use ice on the knee for pain and swelling from surgery. You may notice swelling that will progress down to the foot and ankle.  This is normal after surgery.  Elevate the leg when you are not up walking on it.   Continue to use the breathing machine which will help keep your temperature  down.  It is common for your temperature to cycle up and down following surgery, especially at night when you are not up moving around and exerting yourself.  The breathing machine keeps your lungs expanded and your temperature down. Do not place pillow under knee, focus on keeping the knee straight while resting   DIET You may resume your previous home diet once your are discharged from the hospital.  DRESSING / WOUND CARE / SHOWERING Keep the surgical dressing until follow up.  The dressing is water proof, but you need to put extra covering over it like plastic wrap.  IF THE DRESSING FALLS OFF or the wound gets wet inside, change the dressing with sterile gauze.  Please use good hand washing techniques before changing the dressing.  Do not use any lotions or creams on the incision until instructed by your surgeon.   You may start showering once you are discharged home but do not submerge the incision under water.  You are sent home with an ACE bandage on over the leg, this can be removed at 3 days from surgery. At this time you can start showering. Please place the white TED stocking on the surgical leg after. This needs to be worn on the surgical leg for 2 weeks after surgery.   ACTIVITY Walk with your walker as instructed. Use walker as long as suggested by your caregivers. Avoid periods of inactivity such as sitting longer than an hour when not asleep. This helps prevent blood clots.  You may resume a sexual relationship in one month or when given the OK by your doctor.  You may return to work once you are cleared by your doctor.  Do not drive a car for 6 weeks or until released by you surgeon.  Do not drive while taking narcotics.  WEIGHT BEARING Weight bearing as tolerated with assist device (walker, cane, etc) as directed, use it as long as suggested by your surgeon or therapist, typically at least 4-6 weeks.  POSTOPERATIVE CONSTIPATION PROTOCOL Constipation - defined medically as  fewer than three stools per week and severe constipation as less than one stool per week.  One of the most common issues patients have following surgery is constipation.  Even if you have a regular bowel pattern at home, your normal regimen is likely to be disrupted due to multiple reasons following surgery.  Combination of anesthesia, postoperative narcotics, change in appetite and fluid intake all can affect your bowels.  In order to avoid complications following surgery, here are some recommendations in order to help you during your recovery period.  Colace (docusate) - Pick up an over-the-counter form of Colace or another stool softener and take twice a day as long as you are requiring postoperative pain medications.  Take with a full glass of water daily.  If you experience loose stools or diarrhea, hold the colace until you stool forms back up.  If your symptoms do not get better within 1 week or if they get worse, check with your doctor.  Dulcolax (bisacodyl) - Pick up over-the-counter and  take as directed by the product packaging as needed to assist with the movement of your bowels.  Take with a full glass of water.  Use this product as needed if not relieved by Colace only.   MiraLax (polyethylene glycol) - Pick up over-the-counter to have on hand.  MiraLax is a solution that will increase the amount of water in your bowels to assist with bowel movements.  Take as directed and can mix with a glass of water, juice, soda, coffee, or tea.  Take if you go more than two days without a movement. Do not use MiraLax more than once per day. Call your doctor if you are still constipated or irregular after using this medication for 7 days in a row.  If you continue to have problems with postoperative constipation, please contact the office for further assistance and recommendations.  If you experience "the worst abdominal pain ever" or develop nausea or vomiting, please contact the office immediatly for  further recommendations for treatment.  ITCHING  If you experience itching with your medications, try taking only a single pain pill, or even half a pain pill at a time.  You can also use Benadryl over the counter for itching or also to help with sleep.   TED HOSE STOCKINGS Wear the elastic stockings on both legs for two weeks following surgery during the day but you may remove then at night for sleeping.  Okay to remove ACE in 3 days, put TED on after  MEDICATIONS See your medication summary on the "After Visit Summary" that the nursing staff will review with you prior to discharge.  You may have some home medications which will be placed on hold until you complete the course of blood thinner medication.  It is important for you to complete the blood thinner medication as prescribed by your surgeon.  Continue your approved medications as instructed at time of discharge. If you were not previously taking any blood thinners prior to surgery please start taking Aspirin 325 mg tabs twice daily for 6 weeks. If you are unable to take Aspirin please let your doctor know.   PRECAUTIONS If you experience chest pain or shortness of breath - call 911 immediately for transfer to the hospital emergency department.  If you develop a fever greater that 101 F, purulent drainage from wound, increased redness or drainage from wound, foul odor from the wound/dressing, or calf pain - CONTACT YOUR SURGEON.                                                   FOLLOW-UP APPOINTMENTS Make sure you keep all of your appointments after your operation with your surgeon and caregivers. You should call the office at the above phone number and make an appointment for approximately two weeks after the date of your surgery or on the date instructed by your surgeon outlined in the "After Visit Summary".   RANGE OF MOTION AND STRENGTHENING EXERCISES  Rehabilitation of the knee is important following a knee injury or an operation.  After just a few days of immobilization, the muscles of the thigh which control the knee become weakened and shrink (atrophy). Knee exercises are designed to build up the tone and strength of the thigh muscles and to improve knee motion. Often times heat used for twenty to thirty minutes before working  out will loosen up your tissues and help with improving the range of motion but do not use heat for the first two weeks following surgery. These exercises can be done on a training (exercise) mat, on the floor, on a table or on a bed. Use what ever works the best and is most comfortable for you Knee exercises include:  Leg Lifts - While your knee is still immobilized in a splint or cast, you can do straight leg raises. Lift the leg to 60 degrees, hold for 3 sec, and slowly lower the leg. Repeat 10-20 times 2-3 times daily. Perform this exercise against resistance later as your knee gets better.  Quad and Hamstring Sets - Tighten up the muscle on the front of the thigh (Quad) and hold for 5-10 sec. Repeat this 10-20 times hourly. Hamstring sets are done by pushing the foot backward against an object and holding for 5-10 sec. Repeat as with quad sets.  Leg Slides: Lying on your back, slowly slide your foot toward your buttocks, bending your knee up off the floor (only go as far as is comfortable). Then slowly slide your foot back down until your leg is flat on the floor again. Angel Wings: Lying on your back spread your legs to the side as far apart as you can without causing discomfort.  A rehabilitation program following serious knee injuries can speed recovery and prevent re-injury in the future due to weakened muscles. Contact your doctor or a physical therapist for more information on knee rehabilitation.   IF YOU ARE TRANSFERRED TO A SKILLED REHAB FACILITY If the patient is transferred to a skilled rehab facility following release from the hospital, a list of the current medications will be sent to the  facility for the patient to continue.  When discharged from the skilled rehab facility, please have the facility set up the patient's Home Health Physical Therapy prior to being released. Also, the skilled facility will be responsible for providing the patient with their medications at time of release from the facility to include their pain medication, the muscle relaxants, and their blood thinner medication. If the patient is still at the rehab facility at time of the two week follow up appointment, the skilled rehab facility will also need to assist the patient in arranging follow up appointment in our office and any transportation needs.  MAKE SURE YOU:  Understand these instructions.  Get help right away if you are not doing well or get worse.    Pick up stool softner and laxative for home use following surgery while on pain medications. May shower starting three days after surgery. Please use a clean towel to pat the leg dry following showers. Continue to use ice for pain and swelling after surgery. Do not use any lotions or creams on the incision until instructed by you Start Aspirin immediately following surgery.   Do not put a pillow under the knee. Place it under the heel.   Complete by: As directed    Driving restrictions   Complete by: As directed    No driving for two weeks   Post-operative opioid taper instructions:   Complete by: As directed    POST-OPERATIVE OPIOID TAPER INSTRUCTIONS: It is important to wean off of your opioid medication as soon as possible. If you do not need pain medication after your surgery it is ok to stop day one. Opioids include: Codeine, Hydrocodone(Norco, Vicodin), Oxycodone(Percocet, oxycontin) and hydromorphone amongst others.  Long term and even short  term use of opiods can cause: Increased pain response Dependence Constipation Depression Respiratory depression And more.  Withdrawal symptoms can include Flu like symptoms Nausea,  vomiting And more Techniques to manage these symptoms Hydrate well Eat regular healthy meals Stay active Use relaxation techniques(deep breathing, meditating, yoga) Do Not substitute Alcohol to help with tapering If you have been on opioids for less than two weeks and do not have pain than it is ok to stop all together.  Plan to wean off of opioids This plan should start within one week post op of your joint replacement. Maintain the same interval or time between taking each dose and first decrease the dose.  Cut the total daily intake of opioids by one tablet each day Next start to increase the time between doses. The last dose that should be eliminated is the evening dose.      TED hose   Complete by: As directed    Use stockings (TED hose) for three weeks on both leg(s).  You may remove them at night for sleeping.   Weight bearing as tolerated   Complete by: As directed       Allergies as of 01/05/2022       Reactions   Latex Rash        Medication List     TAKE these medications    albuterol 108 (90 Base) MCG/ACT inhaler Commonly known as: VENTOLIN HFA Inhale 2 puffs into the lungs every 6 (six) hours as needed for wheezing or shortness of breath.   amitriptyline 50 MG tablet Commonly known as: ELAVIL Take 100 mg by mouth at bedtime.   aspirin EC 81 MG tablet Take 1 tablet (81 mg total) by mouth in the morning and at bedtime. Swallow whole.   atorvastatin 40 MG tablet Commonly known as: LIPITOR TAKE 1 TABLET BY MOUTH DAILY AT 6PM.   buPROPion 150 MG 24 hr tablet Commonly known as: WELLBUTRIN XL Take 150 mg by mouth daily.   carvedilol 25 MG tablet Commonly known as: COREG Take 1 tablet (25 mg total) by mouth 2 (two) times daily with a meal.   citalopram 10 MG tablet Commonly known as: CELEXA Take 10 mg by mouth daily.   COQ-10 PO Take 1 tablet by mouth daily.   dapagliflozin propanediol 10 MG Tabs tablet Commonly known as: Farxiga Take 1 tablet  (10 mg total) by mouth daily before breakfast.   diphenhydramine-acetaminophen 25-500 MG Tabs tablet Commonly known as: TYLENOL PM Take 2 tablets by mouth at bedtime as needed (for pain).   gabapentin 300 MG capsule Commonly known as: NEURONTIN Take 1 capsule (300 mg total) by mouth 2 (two) times daily for 14 days.   HYDROmorphone 2 MG tablet Commonly known as: DILAUDID Take 1-2 tablets (2-4 mg total) by mouth every 4 (four) hours as needed for severe pain (pain score 7-10).   methocarbamol 500 MG tablet Commonly known as: ROBAXIN Take 1 tablet (500 mg total) by mouth every 6 (six) hours as needed for muscle spasms.   ondansetron 4 MG tablet Commonly known as: ZOFRAN Take 1 tablet (4 mg total) by mouth every 6 (six) hours as needed for nausea.   sacubitril-valsartan 97-103 MG Commonly known as: ENTRESTO Take 1 tablet by mouth 2 (two) times daily. Temporary script until mail order arrives.   spironolactone 25 MG tablet Commonly known as: ALDACTONE Take 1 tablet (25 mg total) by mouth daily.   traMADol 50 MG tablet Commonly known as: ULTRAM Take  1 tablet (50 mg total) by mouth every 6 (six) hours as needed for moderate pain.               Durable Medical Equipment  (From admission, onward)           Start     Ordered   01/05/22 0649  For home use only DME Walker rolling  Once       Question Answer Comment  Walker: With 5 Inch Wheels   Patient needs a walker to treat with the following condition Osteoarthritis of left knee      01/05/22 0648   01/04/22 1208  For home use only DME Walker rolling  Once       Question Answer Comment  Walker: With 5 Inch Wheels   Patient needs a walker to treat with the following condition Assistance needed for mobility      01/04/22 1208              Discharge Care Instructions  (From admission, onward)           Start     Ordered   01/05/22 0000  Weight bearing as tolerated        01/05/22 0659              Signed: Cherie Dark, PA-C [facility] [phone] 01/05/2022, 7:04 AM

## 2022-01-05 NOTE — Plan of Care (Signed)
  Problem: Education: Goal: Knowledge of General Education information will improve Description Including pain rating scale, medication(s)/side effects and non-pharmacologic comfort measures Outcome: Progressing   Problem: Health Behavior/Discharge Planning: Goal: Ability to manage health-related needs will improve Outcome: Progressing   

## 2022-01-05 NOTE — Progress Notes (Signed)
Physical Therapy Treatment Patient Details Name: Krista Jenkins MRN: [id number] DOB: 03-10-1961 Today's Date: 01/05/2022   History of Present Illness Pt is a 61yo female presenting s/p L-TKA on 01/03/22 PMH: CHF, HLD, neck and back pain,    PT Comments    Great tolerance for mobility today. ModI for bed mobility and transfers. Gait training 163ft with RW and Supervision. Pt able to negotiate 4 steps with R sided rail and [employer]. Husband present for session. Pt cleared functionally for d/c home.   Recommendations for follow up therapy are one component of a multi-disciplinary discharge planning process, led by the attending physician.  Recommendations may be updated based on patient status, additional functional criteria and insurance authorization.  Follow Up Recommendations  Follow physician's recommendations for discharge plan and follow up therapies     Assistance Recommended at Discharge Intermittent Supervision/Assistance  Patient can return home with the following A little help with walking and/or transfers;A little help with bathing/dressing/bathroom;Assistance with cooking/housework;Assist for transportation;Help with stairs or ramp for entrance   Equipment Recommendations  None recommended by PT    Recommendations for Other Services       Precautions / Restrictions Precautions Precautions: Fall Restrictions Weight Bearing Restrictions: No LLE Weight Bearing: Weight bearing as tolerated     Mobility  Bed Mobility Overal bed mobility: Modified Independent                  Transfers Overall transfer level: Modified independent Equipment used: Rolling walker (2 wheels) Transfers: Sit to/from Stand Sit to Stand: Modified independent (Device/Increase time)                Ambulation/Gait Ambulation/Gait assistance: Supervision Gait Distance (Feet): 150 Feet Assistive device: Rolling walker (2 wheels) Gait Pattern/deviations: Step-to pattern,  Decreased weight shift to left Gait velocity: decreased     General Gait Details: No c/o dizziness   Stairs Stairs: Yes Stairs assistance: Min guard Stair Management: One rail Right Number of Stairs: 4 General stair comments: vc's for proper sequencing   Wheelchair Mobility    Modified Rankin (Stroke Patients Only)       Balance                                            Cognition Arousal/Alertness: Awake/alert Behavior During Therapy: WFL for tasks assessed/performed Overall Cognitive Status: Within Functional Limits for tasks assessed                                          Exercises      General Comments General comments (skin integrity, edema, etc.): Reviewed strengthening exercises and out-pt PT -vs- HHPT      Pertinent Vitals/Pain Pain Assessment Pain Assessment: No/denies pain Pain Score: 3  Pain Location: L knee Pain Descriptors / Indicators: Aching, Discomfort Pain Intervention(s): Monitored during session, Premedicated before session, Ice applied    Home Living                          Prior Function            PT Goals (current goals can now be found in the care plan section) Acute Rehab PT Goals Patient Stated Goal: To reduce pain Progress towards PT goals: Progressing  toward goals    Frequency    7X/week      PT Plan Current plan remains appropriate    Co-evaluation              AM-PAC PT "6 Clicks" Mobility   Outcome Measure  Help needed turning from your back to your side while in a flat bed without using bedrails?: None Help needed moving from lying on your back to sitting on the side of a flat bed without using bedrails?: None Help needed moving to and from a bed to a chair (including a wheelchair)?: A Little Help needed standing up from a chair using your arms (e.g., wheelchair or bedside chair)?: A Little Help needed to walk in hospital room?: A Little Help needed  climbing 3-5 steps with a railing? : A Little 6 Click Score: 20    End of Session Equipment Utilized During Treatment: Gait belt Activity Tolerance: Patient tolerated treatment well Patient left: in bed;with call bell/phone within reach;with family/visitor present Nurse Communication: Mobility status PT Visit Diagnosis: Pain;Difficulty in walking, not elsewhere classified (R26.2) Pain - Right/Left: Left Pain - part of body: Knee     Time: 1000-1024 PT Time Calculation (min) (ACUTE ONLY): 24 min  Charges:  $Gait Training: 8-22 mins $Therapeutic Exercise: 8-22 mins          Zadie Cleverly, PTA    Krista Jenkins 01/05/2022, 2:36 PM

## 2022-01-05 NOTE — Progress Notes (Signed)
The patient is alert and oriented and has been seen by her physician. The orders for discharge were written. IV has been removed. Ace wrap removed. Aquacel dressing clean, dry, and intact. Went over discharge instructions with patient and family. She is being discharged via wheelchair with all of her belongings.

## 2022-01-05 NOTE — Progress Notes (Signed)
Subjective: 2 Days Post-Op Procedure(s) (LRB): TOTAL KNEE ARTHROPLASTY (Left) Patient reports pain as mild.  Pain is well controlled on p.o. pain medication She was able to get up with therapy yesterday, reports able to being ambulating in the hallway Doing much better today  Objective: Vital signs in last 24 hours: Temp:  [98 F (36.7 C)-98.8 F (37.1 C)] 98.8 F (37.1 C) (07/14 0447) Pulse Rate:  [89-100] 100 (07/14 0447) Resp:  [16-17] 17 (07/14 0447) BP: (110-145)/(72-87) 110/72 (07/14 0447) SpO2:  [90 %-95 %] 90 % (07/14 0447)  Intake/Output from previous day: 07/13 0701 - 07/14 0700 In: 1910.8 [P.O.:1560; I.V.:295.8; IV Piggyback:55] Out: -  Intake/Output this shift: Total I/O In: 720 [P.O.:720] Out: -   Recent Labs    01/03/22 1130 01/04/22 0353  HGB 13.2 12.2   Recent Labs    01/03/22 1130 01/04/22 0353  WBC 5.1 11.0*  RBC 4.40 3.97  HCT 41.4 37.3  PLT 196 201   Recent Labs    01/03/22 1130  CREATININE 0.83   No results for input(s): "LABPT", "INR" in the last 72 hours.  Neurologically intact Neurovascular intact Sensation intact distally Intact pulses distally Dorsiflexion/Plantar flexion intact Incision: dressing C/D/I No cellulitis present Compartment soft   Assessment/Plan: 2 Days Post-Op Procedure(s) (LRB): TOTAL KNEE ARTHROPLASTY (Left) Up with therapy as long as patient passes therapy this morning she is able to be discharged home All medications have been sent to the pharmacy DVT prophylactics will be aspirin 81 mg twice daily for DVT prevention We discussed normal GI precautions She will start home health physical therapy We will see her back in the office in 2 weeks, leave bandage on until seen in the office.  She can come out of the Ace bandage and a day or 2.  She needs to place a compression stocking on the surgical leg once the Ace bandage comes off    Patient's anticipated LOS is less than 2 midnights, meeting these  requirements: - Younger than 72 - Lives within 1 hour of care - Has a competent adult at home to recover with post-op recover - NO history of  - Chronic pain requiring opiods  - Diabetes  - Coronary Artery Disease  - Heart failure  - Heart attack  - Stroke  - DVT/VTE  - Cardiac arrhythmia  - Respiratory Failure/COPD  - Renal failure  - Anemia  - Advanced Liver disease     Cherie Dark, PA-C With Dr. Shawnie Pons [phone] 01/05/2022, 6:59 AM

## 2022-01-09 ENCOUNTER — Encounter (HOSPITAL_COMMUNITY): Payer: Self-pay | Admitting: Specialist

## 2022-01-12 ENCOUNTER — Encounter (HOSPITAL_COMMUNITY): Payer: Self-pay | Admitting: Internal Medicine

## 2022-03-22 ENCOUNTER — Other Ambulatory Visit: Payer: Self-pay | Admitting: Cardiology

## 2022-03-29 ENCOUNTER — Encounter (HOSPITAL_COMMUNITY): Payer: Self-pay | Admitting: Internal Medicine

## 2022-03-29 ENCOUNTER — Ambulatory Visit (HOSPITAL_BASED_OUTPATIENT_CLINIC_OR_DEPARTMENT_OTHER)
Admission: RE | Admit: 2022-03-29 | Discharge: 2022-03-29 | Disposition: A | Discharge status: Home / Self Care | Payer: Commercial Managed Care - HMO | Source: Ambulatory Visit | Attending: Internal Medicine | Admitting: Internal Medicine

## 2022-03-29 ENCOUNTER — Ambulatory Visit (HOSPITAL_COMMUNITY)
Admission: RE | Admit: 2022-03-29 | Discharge: 2022-03-29 | Disposition: A | Discharge status: Home / Self Care | Payer: Commercial Managed Care - HMO | Source: Ambulatory Visit | Attending: Internal Medicine | Admitting: Internal Medicine

## 2022-03-29 VITALS — BP 100/70 | HR 81 | Wt 199.2 lb

## 2022-03-29 DIAGNOSIS — M549 Dorsalgia, unspecified: Secondary | ICD-10-CM | POA: Diagnosis not present

## 2022-03-29 DIAGNOSIS — E669 Obesity, unspecified: Secondary | ICD-10-CM | POA: Diagnosis not present

## 2022-03-29 DIAGNOSIS — I5020 Unspecified systolic (congestive) heart failure: Secondary | ICD-10-CM

## 2022-03-29 DIAGNOSIS — I110 Hypertensive heart disease with heart failure: Secondary | ICD-10-CM | POA: Insufficient documentation

## 2022-03-29 DIAGNOSIS — Z7984 Long term (current) use of oral hypoglycemic drugs: Secondary | ICD-10-CM | POA: Insufficient documentation

## 2022-03-29 DIAGNOSIS — Z79899 Other long term (current) drug therapy: Secondary | ICD-10-CM | POA: Diagnosis not present

## 2022-03-29 DIAGNOSIS — R5383 Other fatigue: Secondary | ICD-10-CM | POA: Diagnosis not present

## 2022-03-29 DIAGNOSIS — I10 Essential (primary) hypertension: Secondary | ICD-10-CM | POA: Diagnosis not present

## 2022-03-29 DIAGNOSIS — I428 Other cardiomyopathies: Secondary | ICD-10-CM | POA: Diagnosis not present

## 2022-03-29 DIAGNOSIS — I5022 Chronic systolic (congestive) heart failure: Secondary | ICD-10-CM

## 2022-03-29 DIAGNOSIS — G8929 Other chronic pain: Secondary | ICD-10-CM | POA: Diagnosis not present

## 2022-03-29 LAB — ECHOCARDIOGRAM COMPLETE
Area-P 1/2: 3.16 cm2
S' Lateral: 3.5 cm

## 2022-03-29 NOTE — Patient Instructions (Signed)
There has been no changes to your medications   Your physician recommends that you schedule a follow-up appointment in: 6 months ( April 2024)  ** please call office in February 2024 to arrange your follow up appointment **   If you have any questions or concerns before your next appointment please send [place] a message through [place] or call our office at [phone].    TO LEAVE A MESSAGE FOR THE NURSE SELECT OPTION 2, PLEASE LEAVE A MESSAGE INCLUDING: YOUR NAME DATE OF BIRTH CALL BACK NUMBER REASON FOR CALL**this is important as we prioritize the call backs  YOU WILL RECEIVE A CALL BACK THE SAME DAY AS LONG AS YOU CALL BEFORE 4:00 PM  At the [facility], you and your health needs are our priority. As part of our continuing mission to provide you with exceptional heart care, we have created designated Provider Care Teams. These Care Teams include your primary Cardiologist (physician) and Advanced Practice Providers (APPs- Physician Assistants and Nurse Practitioners) who all work together to provide you with the care you need, when you need it.   You may see any of the following providers on your designated Care Team at your next follow up: Dr Glori Bickers Dr Loralie Champagne Dr. Roxana Hires, NP Lyda Jester, [place] [facility] [place], [place] Forestine Na, NP Audry Riles, PharmD   Please be sure to bring in all your medications bottles to every appointment.

## 2022-03-29 NOTE — Progress Notes (Signed)
ADVANCED HF CLINIC NOTE  Primary Care: Dr. Luna Kitchens [facility])  Primary Cardiologist: Dr. [name] [facility]: Dr. Gala Romney   Reason for Visit: f/u for chronic systolic heart failure  HPI: Krista Jenkins is a 61 y.o. female with a HL, chronic back pain and systolic HF due to NICM.  Presented to [facility] in 12/20 with new onset HF. EF 20-25% on echo. Transferred to [facility] for cath. Cath with EF < 25% and normal cors. Pulmonary capillary wedge pressure was 15 mmHg and a mean pulmonary pressure was 18 mmHg, cardiac output 4.9 L with a cardiac index of 2.5.   Echo 11/21 EF 40-45%  Today she returns for HF follow up with her husband. Had knee surgery in July, healing very well. Overall doing fine just concerned about some weight gain but working to lose it again. Previously very fatigued 2/2 strict diet during last visit, since then fatigue minimal. Does get some SOB/fatigued when climbing up stairs. Tolerating meds well. Denies CP and SOB.   Echo today 55-60%  Cardiac Studies: cMRI (1/21): EF 16%, no LGE, mild MR. Normal RV Echo (10/01/19): EF < 20% normal RV.  Echo (01/21/20): EF slightly improved 25% RV normal. Echo (11/21): EF 40-45% RV ok.  Past Medical History:  Diagnosis Date   ACC/AHA stage C systolic heart failure (HCC) 08/21/2019   Acute systolic CHF (congestive heart failure) (HCC) 06/10/2019   Anxiety    Asthma 05/16/2018   Chronic back pain 05/16/2018   Degeneration of lumbar intervertebral disc 06/27/2018   Depressed left ventricular ejection fraction    Depression    Heart failure with reduced ejection fraction (HCC)    Hyperlipidemia    Mixed hyperlipidemia 08/21/2019   Neck pain 05/16/2018   Nonischemic cardiomyopathy (HCC)    NYHA class 3 heart failure with reduced ejection fraction (HCC) 08/21/2019   Pneumonia    Scoliosis deformity of spine 07/17/2018    Current Outpatient Medications  Medication Sig Dispense Refill   albuterol (VENTOLIN HFA) 108 (90  Base) MCG/ACT inhaler Inhale 2 puffs into the lungs every 6 (six) hours as needed for wheezing or shortness of breath.     amitriptyline (ELAVIL) 50 MG tablet Take 100 mg by mouth at bedtime.     atorvastatin (LIPITOR) 40 MG tablet TAKE 1 TABLET DAILY AT 6 P.M.. PLEASE SCHEDULE APPOINTMENT FOR FUTURE REFILLS. 90 tablet 2   buPROPion (WELLBUTRIN XL) 150 MG 24 hr tablet Take 150 mg by mouth daily.     carvedilol (COREG) 25 MG tablet Take 1 tablet (25 mg total) by mouth 2 (two) times daily with a meal. 180 tablet 2   citalopram (CELEXA) 10 MG tablet Take 10 mg by mouth daily.     Coenzyme Q10 (COQ-10 PO) Take 1 tablet by mouth daily.     dapagliflozin propanediol (FARXIGA) 10 MG TABS tablet Take 1 tablet (10 mg total) by mouth daily before breakfast. 90 tablet 3   diphenhydramine-acetaminophen (TYLENOL PM) 25-500 MG TABS tablet Take 2 tablets by mouth at bedtime as needed (for pain).     gabapentin (NEURONTIN) 300 MG capsule Take 1 capsule (300 mg total) by mouth 2 (two) times daily for 14 days. 28 capsule 0   HYDROmorphone (DILAUDID) 2 MG tablet Take 1-2 tablets (2-4 mg total) by mouth every 4 (four) hours as needed for severe pain (pain score 7-10). 40 tablet 0   methocarbamol (ROBAXIN) 500 MG tablet Take 1 tablet (500 mg total) by mouth every 6 (  six) hours as needed for muscle spasms. 60 tablet 0   ondansetron (ZOFRAN) 4 MG tablet Take 1 tablet (4 mg total) by mouth every 6 (six) hours as needed for nausea. 20 tablet 0   sacubitril-valsartan (ENTRESTO) 97-103 MG Take 1 tablet by mouth 2 (two) times daily. Temporary script until mail order arrives. 60 tablet 0   spironolactone (ALDACTONE) 25 MG tablet Take 1 tablet (25 mg total) by mouth daily. 90 tablet 2   traMADol (ULTRAM) 50 MG tablet Take 1 tablet (50 mg total) by mouth every 6 (six) hours as needed for moderate pain. 40 tablet 0   No current facility-administered medications for this encounter.   Allergies  Allergen Reactions   Latex Rash    Social History   Socioeconomic History   Marital status: Married    Spouse name: Not on file   Number of children: Not on file   Years of education: Not on file   Highest education level: Not on file  Occupational History   Not on file  Tobacco Use   Smoking status: Former   Smokeless tobacco: Never  Vaping Use   Vaping Use: Never used  Substance and Sexual Activity   Alcohol use: Never   Drug use: Never   Sexual activity: Not on file  Other Topics Concern   Not on file  Social History Narrative   Not on file   Social Determinants of Health   Financial Resource Strain: Not on file  Food Insecurity: Not on file  Transportation Needs: Not on file  Physical Activity: Not on file  Stress: Not on file  Social Connections: Not on file  Intimate Partner Violence: Not on file   Family History  Problem Relation Age of Onset   CAD Father    Hypertension Brother    There were no vitals filed for this visit.   Wt Readings from Last 3 Encounters:  01/03/22 83 kg (183 lb)  12/21/21 83 kg (183 lb)  11/09/21 82 kg (180 lb 12.8 oz)   PHYSICAL EXAM: General:  well appearing.  No respiratory difficulty HEENT: normal Neck: supple. JVD ~7/8 cm. Carotids 2+ bilat; no bruits. No lymphadenopathy or thyromegaly appreciated. Cor: PMI nondisplaced. Regular rate & rhythm. No rubs, gallops or murmurs. Lungs: clear Abdomen: soft, nontender, nondistended. No hepatosplenomegaly. No bruits or masses. Good bowel sounds. Extremities: no cyanosis, clubbing, rash, edema  Neuro: alert & oriented x 3, cranial nerves grossly intact. moves all 4 extremities w/o difficulty. Affect pleasant.   ASSESSMENT & PLAN:  1. 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 EF < 20% - cMRI 1/21 LVEF 16% with no LGE Mild MR. Normal RV - Echo (10/01/19): EF < 20% normal RV.  - Echo (01/21/20): EF 25%, RV normal.  - Echo (05/15/20): EF 40-45% RV normal.  - Echo 02/14/21: EF 40-45% RV ok  -  Bedside echo 2/23 EF 40-45% - Echo today 55-60% - Stable NYHA II, volume status stable  - Continue Entresto 97/103 bid  - Continue carvedilol 25 mg bid. - Continue Farxiga 10 mg daily. - Continue spiro 25 mg daily.  2. HTN - Blood pressure well controlled. Continue current regimen.   3. HLD - Followed by general cardiology. - Continue atorvastatin.  4. Obesity - Doing well with weight loss program    Arvilla Meres, MD  1:06 PM  Patient seen and examined with the above-signed Advanced Practice Provider and/or Housestaff. I personally reviewed laboratory data,  imaging studies and relevant notes. I independently examined the patient and formulated the important aspects of the plan. I have edited the note to reflect any of my changes or salient points. I have personally discussed the plan with the patient and/or family.  Feeling much better. NYHA I. Echo today with normalization of LV function. EF 55-60%. Volume status looks good. BP soft but stable.   General:  Well appearing. No resp difficulty HEENT: normal Neck: supple. no JVD. Carotids 2+ bilat; no bruits. No lymphadenopathy or thryomegaly appreciated. Cor: PMI nondisplaced. Regular rate & rhythm. No rubs, gallops or murmurs. Lungs: clear Abdomen: soft, nontender, nondistended. No hepatosplenomegaly. No bruits or masses. Good bowel sounds. Extremities: no cyanosis, clubbing, rash, edema Neuro: alert & orientedx3, cranial nerves grossly intact. moves all 4 extremities w/o difficulty. Affect pleasant  Doing very well. EF has recovered with medical therapy. Continue current regimen. Watch for low BP.   Glori Bickers, MD  3:47 PM

## 2022-04-24 ENCOUNTER — Other Ambulatory Visit (HOSPITAL_COMMUNITY): Payer: Self-pay | Admitting: Internal Medicine

## 2022-06-14 ENCOUNTER — Other Ambulatory Visit (HOSPITAL_COMMUNITY): Payer: Self-pay

## 2022-06-14 DIAGNOSIS — I5020 Unspecified systolic (congestive) heart failure: Secondary | ICD-10-CM

## 2022-06-14 MED ORDER — DAPAGLIFLOZIN PROPANEDIOL 10 MG PO TABS: 10.0000 mg | ORAL_TABLET | Freq: Every day | ORAL | 3 refills | Status: DC

## 2022-06-14 MED ORDER — SPIRONOLACTONE 25 MG PO TABS: 25.0000 mg | ORAL_TABLET | Freq: Every day | ORAL | 2 refills | Status: DC

## 2022-07-17 ENCOUNTER — Other Ambulatory Visit (HOSPITAL_COMMUNITY): Payer: Self-pay | Admitting: Internal Medicine

## 2022-07-17 DIAGNOSIS — I5020 Unspecified systolic (congestive) heart failure: Secondary | ICD-10-CM

## 2022-08-29 ENCOUNTER — Other Ambulatory Visit (HOSPITAL_COMMUNITY): Payer: Self-pay

## 2022-08-29 DIAGNOSIS — I5022 Chronic systolic (congestive) heart failure: Secondary | ICD-10-CM

## 2022-10-11 ENCOUNTER — Ambulatory Visit (HOSPITAL_COMMUNITY): Payer: Commercial Managed Care - HMO

## 2022-10-11 ENCOUNTER — Encounter (HOSPITAL_COMMUNITY): Payer: Commercial Managed Care - HMO | Admitting: Internal Medicine

## 2022-12-12 ENCOUNTER — Encounter (HOSPITAL_COMMUNITY): Payer: Self-pay | Admitting: Internal Medicine

## 2022-12-12 ENCOUNTER — Ambulatory Visit (HOSPITAL_COMMUNITY)
Admission: RE | Admit: 2022-12-12 | Discharge: 2022-12-12 | Disposition: A | Discharge status: Home / Self Care | Payer: Commercial Managed Care - HMO | Source: Ambulatory Visit | Attending: Internal Medicine | Admitting: Internal Medicine

## 2022-12-12 ENCOUNTER — Ambulatory Visit (HOSPITAL_BASED_OUTPATIENT_CLINIC_OR_DEPARTMENT_OTHER)
Admission: RE | Admit: 2022-12-12 | Discharge: 2022-12-12 | Disposition: A | Discharge status: Home / Self Care | Payer: Commercial Managed Care - HMO | Source: Ambulatory Visit | Attending: Internal Medicine | Admitting: Internal Medicine

## 2022-12-12 VITALS — BP 94/60 | HR 70 | Wt 190.6 lb

## 2022-12-12 DIAGNOSIS — E669 Obesity, unspecified: Secondary | ICD-10-CM | POA: Insufficient documentation

## 2022-12-12 DIAGNOSIS — I110 Hypertensive heart disease with heart failure: Secondary | ICD-10-CM | POA: Insufficient documentation

## 2022-12-12 DIAGNOSIS — Z8249 Family history of ischemic heart disease and other diseases of the circulatory system: Secondary | ICD-10-CM | POA: Diagnosis not present

## 2022-12-12 DIAGNOSIS — J45909 Unspecified asthma, uncomplicated: Secondary | ICD-10-CM | POA: Insufficient documentation

## 2022-12-12 DIAGNOSIS — I5022 Chronic systolic (congestive) heart failure: Secondary | ICD-10-CM | POA: Diagnosis present

## 2022-12-12 DIAGNOSIS — R9431 Abnormal electrocardiogram [ECG] [EKG]: Secondary | ICD-10-CM | POA: Insufficient documentation

## 2022-12-12 DIAGNOSIS — M549 Dorsalgia, unspecified: Secondary | ICD-10-CM | POA: Insufficient documentation

## 2022-12-12 DIAGNOSIS — I428 Other cardiomyopathies: Secondary | ICD-10-CM | POA: Insufficient documentation

## 2022-12-12 DIAGNOSIS — G8929 Other chronic pain: Secondary | ICD-10-CM | POA: Diagnosis not present

## 2022-12-12 DIAGNOSIS — Z87891 Personal history of nicotine dependence: Secondary | ICD-10-CM | POA: Insufficient documentation

## 2022-12-12 DIAGNOSIS — Z79899 Other long term (current) drug therapy: Secondary | ICD-10-CM | POA: Diagnosis not present

## 2022-12-12 LAB — ECHOCARDIOGRAM COMPLETE
AR max vel: 2.29 cm2
AV Area VTI: 2.14 cm2
AV Area mean vel: 2.11 cm2
AV Mean grad: 4 mmHg
AV Peak grad: 7.2 mmHg
Ao pk vel: 1.35 m/s
Area-P 1/2: 3.53 cm2
Calc EF: 61.1 %
S' Lateral: 3.5 cm
Single Plane A2C EF: 54.3 %
Single Plane A4C EF: 65.4 %

## 2022-12-12 MED ORDER — ENTRESTO 49-51 MG PO TABS: 1.0000 | ORAL_TABLET | Freq: Two times a day (BID) | ORAL | 11 refills | Status: DC

## 2022-12-12 NOTE — Patient Instructions (Signed)
Medication Changes:  DECREASE ENTRESTO TO 49/51mg  TWICE DAILY    Testing/Procedures:  Your physician has requested that you have an echocardiogram. Echocardiography is a painless test that uses sound waves to create images of your heart. It provides your doctor with information about the size and shape of your heart and how well your heart's chambers and valves are working. This procedure takes approximately one hour. There are no restrictions for this procedure. Please do NOT wear cologne, perfume, aftershave, or lotions (deodorant is allowed). Please arrive 15 minutes prior to your appointment time.   Follow-Up in: 1 year PLEASE CALL OUR OFFICE TO SCHEDULE IN MARCH OF 2025- NUMBER IS [phone] OPTION 2   At the [facility], you and your health needs are our priority. We have a designated team specialized in the treatment of Heart Failure. This Care Team includes your primary Heart Failure Specialized Cardiologist (physician), Advanced Practice Providers (APPs- Physician Assistants and Nurse Practitioners), and Pharmacist who all work together to provide you with the care you need, when you need it.   You may see any of the following providers on your designated Care Team at your next follow up:  Dr. Arvilla Meres Dr. Marca Ancona Dr. Marcos Eke, NP Robbie Lis, [place] [facility] [place], [place] Brynda Peon, NP Karle Plumber, PharmD   Please be sure to bring in all your medications bottles to every appointment.   Need to Contact us:  If you have any questions or concerns before your next appointment please send [place] a message through [place] or call our office at [phone].    TO LEAVE A MESSAGE FOR THE NURSE SELECT OPTION 2, PLEASE LEAVE A MESSAGE INCLUDING: YOUR NAME DATE OF BIRTH CALL BACK NUMBER REASON FOR CALL**this is important as we prioritize the call backs  YOU WILL RECEIVE A CALL BACK THE SAME DAY AS LONG AS YOU  CALL BEFORE 4:00 PM

## 2022-12-12 NOTE — Progress Notes (Signed)
ADVANCED HF CLINIC NOTE  Primary Care: Dr. Luna Kitchens [facility])  Primary Cardiologist: Dr. [name] [facility]: Dr. Gala Romney   Reason for Visit: f/u for chronic systolic heart failure  HPI: Krista Jenkins is a 62 y.o. female with a HL, chronic back pain and systolic HF due to NICM.  Presented to [facility] in 12/20 with new onset HF. EF 20-25% on echo. Transferred to [facility] for cath. Cath with EF < 25% and normal cors. Pulmonary capillary wedge pressure was 15 mmHg and a mean pulmonary pressure was 18 mmHg, cardiac output 4.9 L with a cardiac index of 2.5.   Echo 11/21 EF 40-45%  Today she returns for HF follow up with her husband. Doing great. Remains active. No CP, SOB orthopnea or PND. About 3 weeks had a bout of low BP with systolics in 70s. Husband thinks it was related to her diet.   Echo today 12/12/22 EF 55-60% G1DD RV ok Personally reviewed    Cardiac Studies: cMRI (1/21): EF 16%, no LGE, mild MR. Normal RV Echo (10/01/19): EF < 20% normal RV.  Echo (01/21/20): EF slightly improved 25% RV normal. Echo (11/21): EF 40-45% RV ok.  Past Medical History:  Diagnosis Date   ACC/AHA stage C systolic heart failure (HCC) 08/21/2019   Acute systolic CHF (congestive heart failure) (HCC) 06/10/2019   Anxiety    Asthma 05/16/2018   Chronic back pain 05/16/2018   Degeneration of lumbar intervertebral disc 06/27/2018   Depressed left ventricular ejection fraction    Depression    Heart failure with reduced ejection fraction (HCC)    Hyperlipidemia    Mixed hyperlipidemia 08/21/2019   Neck pain 05/16/2018   Nonischemic cardiomyopathy (HCC)    NYHA class 3 heart failure with reduced ejection fraction (HCC) 08/21/2019   Pneumonia    Scoliosis deformity of spine 07/17/2018    Current Outpatient Medications  Medication Sig Dispense Refill   albuterol (VENTOLIN HFA) 108 (90 Base) MCG/ACT inhaler Inhale 2 puffs into the lungs every 6 (six) hours as needed for wheezing or shortness of  breath.     amitriptyline (ELAVIL) 50 MG tablet Take 100 mg by mouth at bedtime.     atorvastatin (LIPITOR) 40 MG tablet TAKE 1 TABLET DAILY AT 6 P.M.. PLEASE SCHEDULE APPOINTMENT FOR FUTURE REFILLS. 90 tablet 2   carvedilol (COREG) 25 MG tablet TAKE 1 TABLET TWICE A DAY WITH MEALS 180 tablet 3   citalopram (CELEXA) 20 MG tablet Take 20 mg by mouth daily.     dapagliflozin propanediol (FARXIGA) 10 MG TABS tablet Take 1 tablet (10 mg total) by mouth daily before breakfast. 90 tablet 3   diphenhydramine-acetaminophen (TYLENOL PM) 25-500 MG TABS tablet Take 2 tablets by mouth at bedtime as needed (for pain).     ENTRESTO 97-103 MG TAKE 1 TABLET BY MOUTH 2 TIMES DAILY. 60 tablet 11   spironolactone (ALDACTONE) 25 MG tablet TAKE 1 TABLET DAILY 90 tablet 3   No current facility-administered medications for this encounter.   Allergies  Allergen Reactions   Latex Rash   Social History   Socioeconomic History   Marital status: Married    Spouse name: Not on file   Number of children: Not on file   Years of education: Not on file   Highest education level: Not on file  Occupational History   Not on file  Tobacco Use   Smoking status: Former   Smokeless tobacco: Never  Vaping Use   Vaping Use:  Never used  Substance and Sexual Activity   Alcohol use: Never   Drug use: Never   Sexual activity: Not on file  Other Topics Concern   Not on file  Social History Narrative   Not on file   Social Determinants of Health   Financial Resource Strain: Not on file  Food Insecurity: Not on file  Transportation Needs: Not on file  Physical Activity: Not on file  Stress: Not on file  Social Connections: Not on file  Intimate Partner Violence: Not on file   Family History  Problem Relation Age of Onset   CAD Father    Hypertension Brother    Vitals:   12/12/22 1437  BP: 94/60  Pulse: 70  SpO2: 97%  Weight: 86.5 kg (190 lb 9.6 oz)     Wt Readings from Last 3 Encounters:  12/12/22  86.5 kg (190 lb 9.6 oz)  03/29/22 90.4 kg (199 lb 3.2 oz)  01/03/22 83 kg (183 lb)   PHYSICAL EXAM: General:  Well appearing. No resp difficulty HEENT: normal Neck: supple. no JVD. Carotids 2+ bilat; no bruits. No lymphadenopathy or thryomegaly appreciated. Cor: PMI nondisplaced. Regular rate & rhythm. No rubs, gallops or murmurs. Lungs: clear Abdomen: soft, nontender, nondistended. No hepatosplenomegaly. No bruits or masses. Good bowel sounds. Extremities: no cyanosis, clubbing, rash, edema Neuro: alert & orientedx3, cranial nerves grossly intact. moves all 4 extremities w/o difficulty. Affect pleasant   ASSESSMENT & PLAN:  1. 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 EF < 20% - cMRI 1/21 LVEF 16% with no LGE Mild MR. Normal RV - Echo (10/01/19): EF < 20% normal RV.  - Echo (01/21/20): EF 25%, RV normal.  - Echo (05/15/20): EF 40-45% RV normal.  - Echo 02/14/21: EF 40-45% RV ok  - Bedside echo 2/23 EF 40-45% - Echo today 12/12/22 EF 55-60% G1DD  - Stable NYHA II volume status stable  - BP low -> Decrease Entresto 49/51bid  - Continue carvedilol 25 mg bid. - Continue Farxiga 10 mg daily. - Continue spiro 25 mg daily.  2. HTN - BP running low. Decrease Entresto  3. HL - Followed by general cardiology. - Continue atorvastatin.  4. Obesity - Doing well with weight loss program    Arvilla Meres, MD  3:07 PM

## 2023-01-03 ENCOUNTER — Other Ambulatory Visit (HOSPITAL_COMMUNITY): Payer: Self-pay | Admitting: Internal Medicine

## 2023-01-03 ENCOUNTER — Other Ambulatory Visit: Payer: Self-pay | Admitting: Cardiology

## 2023-01-03 ENCOUNTER — Other Ambulatory Visit (HOSPITAL_COMMUNITY): Payer: Self-pay | Admitting: Cardiology

## 2023-01-03 MED ORDER — ATORVASTATIN CALCIUM 40 MG PO TABS: 40.0000 mg | ORAL_TABLET | Freq: Every day | ORAL | 0 refills | Status: DC

## 2023-01-16 ENCOUNTER — Other Ambulatory Visit: Payer: Self-pay

## 2023-01-16 MED ORDER — ATORVASTATIN CALCIUM 40 MG PO TABS: 40.0000 mg | ORAL_TABLET | Freq: Every day | ORAL | 0 refills | Status: DC

## 2023-01-24 ENCOUNTER — Other Ambulatory Visit: Payer: Self-pay | Admitting: Cardiology

## 2023-01-25 ENCOUNTER — Telehealth (HOSPITAL_COMMUNITY): Payer: Self-pay

## 2023-01-25 MED ORDER — ATORVASTATIN CALCIUM 40 MG PO TABS: 40.0000 mg | ORAL_TABLET | Freq: Every day | ORAL | 2 refills | Status: DC

## 2023-01-25 NOTE — Telephone Encounter (Signed)
Patient called needed refill on atorvastatin- patient last seen in office by Dr. Gala Romney on 12/12/22   Per last OV note   3. HL - Followed by general cardiology. - Continue atorvastatin.  Refill for Atorvastatin sent to pharmacy.

## 2023-04-05 ENCOUNTER — Other Ambulatory Visit (HOSPITAL_COMMUNITY): Payer: Self-pay | Admitting: Internal Medicine

## 2023-04-23 ENCOUNTER — Other Ambulatory Visit (HOSPITAL_COMMUNITY): Payer: Self-pay | Admitting: Internal Medicine

## 2023-07-16 ENCOUNTER — Other Ambulatory Visit (HOSPITAL_COMMUNITY): Payer: Self-pay | Admitting: Internal Medicine

## 2023-07-16 ENCOUNTER — Other Ambulatory Visit (HOSPITAL_COMMUNITY): Payer: Self-pay | Admitting: Cardiology

## 2023-07-16 DIAGNOSIS — I5020 Unspecified systolic (congestive) heart failure: Secondary | ICD-10-CM

## 2023-07-16 MED ORDER — CARVEDILOL 25 MG PO TABS: 25.0000 mg | ORAL_TABLET | Freq: Two times a day (BID) | ORAL | 1 refills | Status: DC

## 2023-07-16 MED ORDER — SPIRONOLACTONE 25 MG PO TABS: 25.0000 mg | ORAL_TABLET | Freq: Every day | ORAL | 1 refills | Status: DC

## 2023-07-17 ENCOUNTER — Encounter (HOSPITAL_COMMUNITY): Payer: Self-pay | Admitting: Internal Medicine

## 2023-07-17 DIAGNOSIS — I5020 Unspecified systolic (congestive) heart failure: Secondary | ICD-10-CM

## 2023-11-15 ENCOUNTER — Other Ambulatory Visit (HOSPITAL_COMMUNITY): Payer: Self-pay | Admitting: Internal Medicine

## 2024-01-08 ENCOUNTER — Other Ambulatory Visit (HOSPITAL_COMMUNITY): Payer: Self-pay | Admitting: *Deleted

## 2024-01-08 DIAGNOSIS — I5022 Chronic systolic (congestive) heart failure: Secondary | ICD-10-CM

## 2024-01-15 ENCOUNTER — Other Ambulatory Visit (HOSPITAL_COMMUNITY): Payer: Self-pay | Admitting: Internal Medicine

## 2024-01-28 ENCOUNTER — Other Ambulatory Visit (HOSPITAL_COMMUNITY)

## 2024-01-31 ENCOUNTER — Telehealth (HOSPITAL_COMMUNITY): Payer: Self-pay

## 2024-01-31 NOTE — Telephone Encounter (Signed)
 Called to confirm/remind patient of their appointment at the [facility] on 02/03/24.   Appointment:   [] Confirmed  [x] Left mess   [] No answer/No voice mail  [] VM Full/unable to leave message  [] Phone not in service  And to bring in all medications and/or complete list.

## 2024-01-31 NOTE — Progress Notes (Signed)
 ADVANCED HF CLINIC NOTE  Primary Care: Dr. Tracey [facility])  Primary Cardiologist: Dr. Sheena [facility]: Dr. Cherrie   HPI: Krista Jenkins is a 63 y.o. female with a HL, chronic back pain and systolic HF due to NICM.  Presented to [facility] in 12/20 with new onset HF. EF 20-25% on echo. Transferred to [facility] for cath. Cath with EF < 25% and normal cors. Pulmonary capillary wedge pressure was 15 mmHg and a mean pulmonary pressure was 18 mmHg, cardiac output 4.9 L with a cardiac index of 2.5.   Echo 11/21 EF 40-45%  Echo 12/12/22 EF 55-60%, G1DD RV ok    Today she returns for yearly HF follow up. Overall feeling fine. Has chronic swelling on left inner knee. Denies increasing SOB, palpitations, abnormal bleeding, CP, dizziness, or PND/Orthopnea. Appetite ok. Weight at home 196 pounds. Taking all medications. Plays volleyball and pickleball 3 days/week.  Echo today 02/03/24 EF 55-60%, RV ok, images reviewed with Dr. Zenaida.  Cardiac Studies: Echo 8/25: EF 55-60% Echo 6/24: EF 55-60% G1DD RV ok   Echo 11/21: EF 40-45% RV ok. Echo 7/21: EF slightly improved 25% RV normal. Echo 4/21: EF < 20% normal RV.  cMRI 1/21: EF 16%, no LGE, mild MR. Normal RV  Past Medical History:  Diagnosis Date   ACC/AHA stage C systolic heart failure (HCC) 08/21/2019   Acute systolic CHF (congestive heart failure) (HCC) 06/10/2019   Anxiety    Asthma 05/16/2018   Chronic back pain 05/16/2018   Degeneration of lumbar intervertebral disc 06/27/2018   Depressed left ventricular ejection fraction    Depression    Heart failure with reduced ejection fraction (HCC)    Hyperlipidemia    Mixed hyperlipidemia 08/21/2019   Neck pain 05/16/2018   Nonischemic cardiomyopathy (HCC)    NYHA class 3 heart failure with reduced ejection fraction (HCC) 08/21/2019   Pneumonia    Scoliosis deformity of spine 07/17/2018    Current Outpatient Medications  Medication Sig Dispense Refill   albuterol  (VENTOLIN   HFA) 108 (90 Base) MCG/ACT inhaler Inhale 2 puffs into the lungs every 6 (six) hours as needed for wheezing or shortness of breath.     amitriptyline  (ELAVIL ) 50 MG tablet Take 100 mg by mouth at bedtime.     atorvastatin  (LIPITOR) 40 MG tablet TAKE 1 TABLET DAILY 90 tablet 3   carvedilol  (COREG ) 25 MG tablet Take 1 tablet (25 mg total) by mouth 2 (two) times daily with a meal. 60 tablet 1   citalopram  (CELEXA ) 20 MG tablet Take 20 mg by mouth daily.     diphenhydramine -acetaminophen  (TYLENOL  PM) 25-500 MG TABS tablet Take 2 tablets by mouth at bedtime as needed (for pain).     FARXIGA  10 MG TABS tablet TAKE 1 TABLET DAILY BEFORE BREAKFAST 90 tablet 3   sacubitril -valsartan  (ENTRESTO ) 49-51 MG TAKE 1 TABLET BY MOUTH TWICE DAILY 60 tablet 2   spironolactone  (ALDACTONE ) 25 MG tablet Take 1 tablet (25 mg total) by mouth daily. 30 tablet 1   TIRZEPATIDE-WEIGHT MANAGEMENT [place] Inject into the skin.     No current facility-administered medications for this encounter.   Allergies  Allergen Reactions   Latex Rash   Social History   Socioeconomic History   Marital status: Married    Spouse name: Not on file   Number of children: Not on file   Years of education: Not on file   Highest education level: Not on file  Occupational History   Not  on file  Tobacco Use   Smoking status: Former   Smokeless tobacco: Never  Vaping Use   Vaping status: Never Used  Substance and Sexual Activity   Alcohol use: Never   Drug use: Never   Sexual activity: Not on file  Other Topics Concern   Not on file  Social History Narrative   Not on file   Social Drivers of Health   Financial Resource Strain: Not on file  Food Insecurity: Not on file  Transportation Needs: Not on file  Physical Activity: Not on file  Stress: Not on file  Social Connections: Not on file  Intimate Partner Violence: Not on file   Family History  Problem Relation Age of Onset   CAD Father    Hypertension Brother    Wt  Readings from Last 3 Encounters:  02/03/24 88.1 kg (194 lb 3.2 oz)  12/12/22 86.5 kg (190 lb 9.6 oz)  03/29/22 90.4 kg (199 lb 3.2 oz)   BP 122/64   Pulse 80   Ht 5' 6 (1.676 m)   Wt 88.1 kg (194 lb 3.2 oz)   LMP  (LMP Unknown)   SpO2 96%   BMI 31.34 kg/m   PHYSICAL EXAM: General:  NAD. No resp difficulty, walked into clinic HEENT: Normal Neck: Supple. No JVD. Cor: Regular rate & rhythm. No rubs, gallops or murmurs. Lungs: Clear Abdomen: Soft, obese, nontender, nondistended.  Extremities: No cyanosis, clubbing, rash, edema Neuro: Alert & oriented x 3, moves all 4 extremities w/o difficulty. Affect pleasant.  ECG (personally reviewed): NSR 83 bpm  ASSESSMENT & PLAN: 1. 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 EF < 20% - cMRI 1/21 LVEF 16% with no LGE Mild MR. Normal RV - Echo 4/21: EF < 20% normal RV.  - Echo 7/21: EF 25%, RV normal.  - Echo 11/21: EF 40-45% RV normal.  - Echo 8/22: EF 40-45% RV ok  - Bedside echo 2/23: EF 40-45% - Echo 6/24: EF 55-60% G1DD  - Echo today 02/03/24 showed EF 55-60%, RV normal - Stable NYHA I, volume status stable  - Continue Entresto  49/51 bid  - Continue carvedilol  25 mg bid. - Continue Farxiga  10 mg daily. - Continue spiro 25 mg daily. - Labs today - Follow echo yearly  2. HTN - BP well controlled. - Continue meds as above  3. HL - Followed by general cardiology. - Continue atorvastatin  80 mg daily. - Check lipids today.  4. Obesity - Body mass index is 31.34 kg/m. - She just started tirzepatide   Follow up in 1 year with Dr. Cherrie + echo  Krista CHRISTELLA Gainer, FNP  1:57 PM

## 2024-02-03 ENCOUNTER — Ambulatory Visit (HOSPITAL_COMMUNITY): Payer: Self-pay | Admitting: Family Medicine

## 2024-02-03 ENCOUNTER — Ambulatory Visit (HOSPITAL_BASED_OUTPATIENT_CLINIC_OR_DEPARTMENT_OTHER)
Admission: RE | Admit: 2024-02-03 | Discharge: 2024-02-03 | Disposition: A | Discharge status: Home / Self Care | Source: Ambulatory Visit | Attending: *Deleted | Admitting: *Deleted

## 2024-02-03 ENCOUNTER — Ambulatory Visit (HOSPITAL_COMMUNITY)
Admission: RE | Admit: 2024-02-03 | Discharge: 2024-02-03 | Disposition: A | Discharge status: Home / Self Care | Source: Ambulatory Visit | Attending: Family Medicine | Admitting: Family Medicine

## 2024-02-03 ENCOUNTER — Encounter (HOSPITAL_COMMUNITY): Payer: Self-pay

## 2024-02-03 VITALS — BP 122/64 | HR 80 | Ht 66.0 in | Wt 194.2 lb

## 2024-02-03 VITALS — BP 122/64 | HR 80 | Temp 97.9°F | Resp 16 | Ht 66.0 in | Wt 194.2 lb

## 2024-02-03 DIAGNOSIS — E785 Hyperlipidemia, unspecified: Secondary | ICD-10-CM

## 2024-02-03 DIAGNOSIS — I5020 Unspecified systolic (congestive) heart failure: Secondary | ICD-10-CM | POA: Diagnosis not present

## 2024-02-03 DIAGNOSIS — I10 Essential (primary) hypertension: Secondary | ICD-10-CM | POA: Diagnosis not present

## 2024-02-03 DIAGNOSIS — I371 Nonrheumatic pulmonary valve insufficiency: Secondary | ICD-10-CM | POA: Insufficient documentation

## 2024-02-03 DIAGNOSIS — Z7984 Long term (current) use of oral hypoglycemic drugs: Secondary | ICD-10-CM | POA: Diagnosis not present

## 2024-02-03 DIAGNOSIS — I428 Other cardiomyopathies: Secondary | ICD-10-CM | POA: Diagnosis not present

## 2024-02-03 DIAGNOSIS — E782 Mixed hyperlipidemia: Secondary | ICD-10-CM | POA: Diagnosis not present

## 2024-02-03 DIAGNOSIS — M549 Dorsalgia, unspecified: Secondary | ICD-10-CM | POA: Diagnosis not present

## 2024-02-03 DIAGNOSIS — I5022 Chronic systolic (congestive) heart failure: Secondary | ICD-10-CM

## 2024-02-03 DIAGNOSIS — G8929 Other chronic pain: Secondary | ICD-10-CM | POA: Diagnosis not present

## 2024-02-03 DIAGNOSIS — Z79899 Other long term (current) drug therapy: Secondary | ICD-10-CM | POA: Insufficient documentation

## 2024-02-03 DIAGNOSIS — E669 Obesity, unspecified: Secondary | ICD-10-CM | POA: Insufficient documentation

## 2024-02-03 DIAGNOSIS — I110 Hypertensive heart disease with heart failure: Secondary | ICD-10-CM | POA: Insufficient documentation

## 2024-02-03 DIAGNOSIS — Z6831 Body mass index (BMI) 31.0-31.9, adult: Secondary | ICD-10-CM | POA: Diagnosis not present

## 2024-02-03 DIAGNOSIS — M7989 Other specified soft tissue disorders: Secondary | ICD-10-CM | POA: Diagnosis not present

## 2024-02-03 LAB — COMPREHENSIVE METABOLIC PANEL WITH GFR
ALT: 26 U/L (ref 0–44)
AST: 24 U/L (ref 15–41)
Albumin: 4.3 g/dL (ref 3.5–5.0)
Alkaline Phosphatase: 74 U/L (ref 38–126)
Anion gap: 10 (ref 5–15)
BUN: 16 mg/dL (ref 8–23)
CO2: 26 mmol/L (ref 22–32)
Calcium: 9.6 mg/dL (ref 8.9–10.3)
Chloride: 102 mmol/L (ref 98–111)
Creatinine, Ser: 0.77 mg/dL (ref 0.44–1.00)
GFR, Estimated: >60 mL/min (ref >60)
Glucose, Bld: 94 mg/dL (ref 70–99)
Potassium: 3.9 mmol/L (ref 3.5–5.1)
Sodium: 138 mmol/L (ref 135–145)
Total Bilirubin: 0.7 mg/dL (ref 0.0–1.2)
Total Protein: 7.7 g/dL (ref 6.5–8.1)

## 2024-02-03 LAB — ECHOCARDIOGRAM COMPLETE
Area-P 1/2: 2.96 cm2
S' Lateral: 3.1 cm

## 2024-02-03 LAB — LIPID PANEL
Cholesterol: 115 mg/dL (ref 0–200)
HDL: 41 mg/dL (ref >40)
LDL Cholesterol: 63 mg/dL (ref 0–99)
Total CHOL/HDL Ratio: 2.8 ratio
Triglycerides: 57 mg/dL (ref <150)
VLDL: 11 mg/dL (ref 0–40)

## 2024-02-03 MED ORDER — SACUBITRIL-VALSARTAN 49-51 MG PO TABS: 1.0000 | ORAL_TABLET | Freq: Two times a day (BID) | ORAL | 3 refills | Status: DC

## 2024-02-03 NOTE — Patient Instructions (Signed)
 No change in medications. Labs today - will call you if abnormal. 90 day Rx sent for [facility]. Return to see Dr. Cherrie in 1 year with echo. CALL US  AT [phone] IN JULY 2026 TO SCHEDULE THIS APPOINTMENT.  Please call us  at [phone] if any questions or concerns prior to your next appointment.

## 2024-03-25 ENCOUNTER — Other Ambulatory Visit (HOSPITAL_COMMUNITY): Payer: Self-pay | Admitting: Cardiology

## 2024-03-25 DIAGNOSIS — I10 Essential (primary) hypertension: Secondary | ICD-10-CM

## 2024-03-25 MED ORDER — SACUBITRIL-VALSARTAN 49-51 MG PO TABS: 1.0000 | ORAL_TABLET | Freq: Two times a day (BID) | ORAL | 3 refills | Status: DC

## 2024-04-24 ENCOUNTER — Other Ambulatory Visit (HOSPITAL_COMMUNITY): Payer: Self-pay | Admitting: Internal Medicine

## 2024-04-24 DIAGNOSIS — I10 Essential (primary) hypertension: Secondary | ICD-10-CM

## 2024-04-27 ENCOUNTER — Other Ambulatory Visit (HOSPITAL_COMMUNITY): Payer: Self-pay

## 2024-04-27 ENCOUNTER — Other Ambulatory Visit (HOSPITAL_COMMUNITY): Payer: Self-pay | Admitting: Internal Medicine

## 2024-04-27 DIAGNOSIS — I10 Essential (primary) hypertension: Secondary | ICD-10-CM

## 2024-04-27 MED ORDER — SACUBITRIL-VALSARTAN 49-51 MG PO TABS: 1.0000 | ORAL_TABLET | Freq: Two times a day (BID) | ORAL | 0 refills | Status: DC

## 2024-05-25 ENCOUNTER — Encounter (HOSPITAL_COMMUNITY): Payer: Self-pay | Admitting: Internal Medicine

## 2024-05-26 MED ORDER — CARVEDILOL 12.5 MG PO TABS: 12.5000 mg | ORAL_TABLET | Freq: Two times a day (BID) | ORAL | 6 refills | Status: DC

## 2024-05-26 MED ORDER — SACUBITRIL-VALSARTAN 24-26 MG PO TABS: 1.0000 | ORAL_TABLET | Freq: Two times a day (BID) | ORAL | 6 refills | Status: DC

## 2024-05-28 MED ORDER — SACUBITRIL-VALSARTAN 24-26 MG PO TABS: 1.0000 | ORAL_TABLET | Freq: Two times a day (BID) | ORAL | 3 refills | Status: DC

## 2024-05-28 MED ORDER — CARVEDILOL 12.5 MG PO TABS: 12.5000 mg | ORAL_TABLET | Freq: Two times a day (BID) | ORAL | 3 refills | Status: DC

## 2024-05-28 NOTE — Addendum Note (Signed)
 Addended by: JERONA DALTON HERO on: 05/28/2024 04:35 PM   Modules accepted: Orders

## 2024-07-09 ENCOUNTER — Encounter (HOSPITAL_COMMUNITY): Payer: Self-pay | Admitting: Internal Medicine

## 2024-07-09 DIAGNOSIS — I5020 Unspecified systolic (congestive) heart failure: Secondary | ICD-10-CM

## 2024-07-09 MED ORDER — SACUBITRIL-VALSARTAN 24-26 MG PO TABS: 1.0000 | ORAL_TABLET | Freq: Two times a day (BID) | ORAL | 3 refills | Status: AC

## 2024-07-09 MED ORDER — ATORVASTATIN CALCIUM 40 MG PO TABS: 40.0000 mg | ORAL_TABLET | Freq: Every day | ORAL | 3 refills | Status: AC

## 2024-07-09 MED ORDER — SPIRONOLACTONE 25 MG PO TABS: 25.0000 mg | ORAL_TABLET | Freq: Every day | ORAL | 1 refills | Status: AC

## 2024-07-09 MED ORDER — DAPAGLIFLOZIN PROPANEDIOL 10 MG PO TABS: 10.0000 mg | ORAL_TABLET | Freq: Every day | ORAL | 3 refills | Status: AC

## 2024-07-09 MED ORDER — CARVEDILOL 12.5 MG PO TABS: 12.5000 mg | ORAL_TABLET | Freq: Two times a day (BID) | ORAL | 3 refills | Status: AC
